# Patient Record
Sex: Female | Born: 1955
Health system: Southern US, Community
[De-identification: ages and names within clinical notes are randomized; demographics above are authoritative.]

## PROBLEM LIST (undated history)

## (undated) DIAGNOSIS — K219 Gastro-esophageal reflux disease without esophagitis: Secondary | ICD-10-CM

## (undated) DIAGNOSIS — G43909 Migraine, unspecified, not intractable, without status migrainosus: Secondary | ICD-10-CM

## (undated) DIAGNOSIS — E785 Hyperlipidemia, unspecified: Secondary | ICD-10-CM

## (undated) DIAGNOSIS — M1990 Unspecified osteoarthritis, unspecified site: Secondary | ICD-10-CM

## (undated) DIAGNOSIS — C801 Malignant (primary) neoplasm, unspecified: Secondary | ICD-10-CM

## (undated) DIAGNOSIS — I10 Essential (primary) hypertension: Secondary | ICD-10-CM

## (undated) DIAGNOSIS — Z9889 Other specified postprocedural states: Secondary | ICD-10-CM

## (undated) DIAGNOSIS — Z86010 Personal history of colonic polyps: Secondary | ICD-10-CM

## (undated) DIAGNOSIS — T7840XA Allergy, unspecified, initial encounter: Secondary | ICD-10-CM

## (undated) DIAGNOSIS — F419 Anxiety disorder, unspecified: Secondary | ICD-10-CM

## (undated) DIAGNOSIS — Z860101 Personal history of adenomatous and serrated colon polyps: Secondary | ICD-10-CM

## (undated) HISTORY — DX: Gastro-esophageal reflux disease without esophagitis: K21.9

## (undated) HISTORY — PX: CHOLECYSTECTOMY: SHX55

## (undated) HISTORY — PX: COLONOSCOPY: SHX174

## (undated) HISTORY — DX: Personal history of colonic polyps: Z86.010

## (undated) HISTORY — DX: Personal history of adenomatous and serrated colon polyps: Z86.0101

## (undated) HISTORY — DX: Malignant (primary) neoplasm, unspecified: C80.1

## (undated) HISTORY — PX: BUNIONECTOMY: SHX129

## (undated) HISTORY — DX: Unspecified osteoarthritis, unspecified site: M19.90

## (undated) HISTORY — PX: ROTATOR CUFF REPAIR: SHX139

## (undated) HISTORY — DX: Anxiety disorder, unspecified: F41.9

## (undated) HISTORY — PX: DILATION AND CURETTAGE OF UTERUS: SHX78

## (undated) HISTORY — PX: KNEE ARTHROSCOPY WITH LATERAL RELEASE: SHX5649

## (undated) HISTORY — PX: POLYPECTOMY: SHX149

## (undated) HISTORY — DX: Allergy, unspecified, initial encounter: T78.40XA

## (undated) HISTORY — PX: TONSILLECTOMY: SUR1361

## (undated) HISTORY — PX: JOINT REPLACEMENT: SHX530

## (undated) HISTORY — DX: Migraine, unspecified, not intractable, without status migrainosus: G43.909

## (undated) HISTORY — DX: Hyperlipidemia, unspecified: E78.5

## (undated) HISTORY — DX: Essential (primary) hypertension: I10

---

## 2017-02-18 ENCOUNTER — Other Ambulatory Visit: Payer: Self-pay | Admitting: Family Medicine

## 2017-02-18 ENCOUNTER — Ambulatory Visit
Admission: RE | Admit: 2017-02-18 | Discharge: 2017-02-18 | Disposition: A | Discharge status: Home / Self Care | Source: Ambulatory Visit | Attending: Family Medicine | Admitting: Family Medicine

## 2017-02-18 DIAGNOSIS — M1990 Unspecified osteoarthritis, unspecified site: Secondary | ICD-10-CM

## 2017-12-15 ENCOUNTER — Other Ambulatory Visit: Payer: Self-pay | Admitting: Family Medicine

## 2017-12-15 DIAGNOSIS — Z1231 Encounter for screening mammogram for malignant neoplasm of breast: Secondary | ICD-10-CM

## 2018-04-05 ENCOUNTER — Encounter: Payer: Self-pay | Admitting: Radiology

## 2018-04-05 ENCOUNTER — Ambulatory Visit
Admission: RE | Admit: 2018-04-05 | Discharge: 2018-04-05 | Disposition: A | Discharge status: Home / Self Care | Source: Ambulatory Visit | Attending: Family Medicine | Admitting: Family Medicine

## 2018-04-05 DIAGNOSIS — Z1231 Encounter for screening mammogram for malignant neoplasm of breast: Secondary | ICD-10-CM

## 2018-04-16 ENCOUNTER — Other Ambulatory Visit: Payer: Self-pay | Admitting: Family Medicine

## 2018-04-16 DIAGNOSIS — R928 Other abnormal and inconclusive findings on diagnostic imaging of breast: Secondary | ICD-10-CM

## 2018-04-19 ENCOUNTER — Ambulatory Visit
Admission: RE | Admit: 2018-04-19 | Discharge: 2018-04-19 | Disposition: A | Discharge status: Home / Self Care | Source: Ambulatory Visit | Attending: Family Medicine | Admitting: Family Medicine

## 2018-04-19 DIAGNOSIS — R928 Other abnormal and inconclusive findings on diagnostic imaging of breast: Secondary | ICD-10-CM

## 2018-10-21 ENCOUNTER — Other Ambulatory Visit: Payer: Self-pay | Admitting: Family Medicine

## 2018-10-21 ENCOUNTER — Ambulatory Visit
Admission: RE | Admit: 2018-10-21 | Discharge: 2018-10-21 | Disposition: A | Discharge status: Home / Self Care | Source: Ambulatory Visit | Attending: Family Medicine | Admitting: Family Medicine

## 2018-10-21 ENCOUNTER — Other Ambulatory Visit: Payer: Self-pay

## 2018-10-21 DIAGNOSIS — M25561 Pain in right knee: Secondary | ICD-10-CM

## 2018-10-26 ENCOUNTER — Telehealth: Payer: Self-pay | Admitting: Gastroenterology

## 2018-10-26 NOTE — Telephone Encounter (Signed)
Recv'd medical records from [facility] forwarded 3 pages to [place] (Dr. Armbruster)8/11/20fbg

## 2018-11-01 ENCOUNTER — Telehealth: Payer: Self-pay | Admitting: Gastroenterology

## 2018-11-01 NOTE — Telephone Encounter (Signed)
DOD 10/22/18 Dr. Tarri Glenn,  There is a referral for pt to have a colonoscopy.  Previous colonoscopy report from 2017 will be sent to you for review.

## 2018-11-03 ENCOUNTER — Encounter: Payer: Self-pay | Admitting: Gastroenterology

## 2018-11-25 ENCOUNTER — Ambulatory Visit (AMBULATORY_SURGERY_CENTER): Payer: Self-pay | Admitting: *Deleted

## 2018-11-25 ENCOUNTER — Other Ambulatory Visit: Payer: Self-pay

## 2018-11-25 VITALS — Temp 97.1°F | Ht 66.0 in | Wt 150.0 lb

## 2018-11-25 DIAGNOSIS — Z86010 Personal history of colonic polyps: Secondary | ICD-10-CM

## 2018-11-25 MED ORDER — SUPREP BOWEL PREP KIT 17.5-3.13-1.6 GM/177ML PO SOLN: 1.0000 | Freq: Once | ORAL | 0 refills | Status: AC

## 2018-11-25 NOTE — Progress Notes (Signed)
Slow to wake post op  No egg or soy allergy known to patient  No issues with past sedation with any surgeries  or procedures, no intubation problems  No diet pills per patient No home 02 use per patient  No blood thinners per patient  Pt states  issues with constipation - she will go several days with no BM's then she will have lots of BM's- not diarrhea - pt does not take meds to have results- when she increases water intake, she has good BM's  No A fib or A flutter  EMMI video sent to pt's e mail

## 2018-11-30 ENCOUNTER — Encounter: Payer: Self-pay | Admitting: Gastroenterology

## 2018-12-09 ENCOUNTER — Ambulatory Visit (AMBULATORY_SURGERY_CENTER): Payer: Medicare Other | Admitting: Gastroenterology

## 2018-12-09 ENCOUNTER — Other Ambulatory Visit: Payer: Self-pay

## 2018-12-09 ENCOUNTER — Encounter: Payer: Self-pay | Admitting: Gastroenterology

## 2018-12-09 VITALS — BP 109/68 | HR 72 | Temp 98.1°F | Resp 7 | Ht 66.0 in | Wt 150.0 lb

## 2018-12-09 DIAGNOSIS — D123 Benign neoplasm of transverse colon: Secondary | ICD-10-CM | POA: Diagnosis not present

## 2018-12-09 DIAGNOSIS — Z86010 Personal history of colonic polyps: Secondary | ICD-10-CM

## 2018-12-09 DIAGNOSIS — D120 Benign neoplasm of cecum: Secondary | ICD-10-CM

## 2018-12-09 MED ORDER — SODIUM CHLORIDE 0.9 % IV SOLN: 500.0000 mL | Freq: Once | INTRAVENOUS | Status: DC

## 2018-12-09 NOTE — Op Note (Addendum)
[place] Patient Name: Jerrilyn Schwenn Procedure Date: 12/09/2018 8:53 AM MRN: [id number] Endoscopist: Thornton Park MD, MD Age: 63 Referring MD:  Date of Birth: 07-20-1955 Gender: Female Account #: [id number] Procedure:                Colonoscopy Indications:              Surveillance: Personal history of adenomatous                            polyps on last colonoscopy 3 years ago                           Colonoscopy 08/01/15 in [place]: 5 polyps, sigmoid                            diverticulosis                           Adenomas and hyperplastic polyps on pathology                           Surveillance recommended in 3 years Medicines:                See the Anesthesia note for documentation of the                            administered medications Procedure:                Pre-Anesthesia Assessment:                           - Prior to the procedure, a History and Physical                            was performed, and patient medications and                            allergies were reviewed. The patient's tolerance of                            previous anesthesia was also reviewed. The risks                            and benefits of the procedure and the sedation                            options and risks were discussed with the patient.                            All questions were answered, and informed consent                            was obtained. Prior Anticoagulants: The patient has                            taken  no previous anticoagulant or antiplatelet                            agents. ASA Grade Assessment: II - A patient with                            mild systemic disease. After reviewing the risks                            and benefits, the patient was deemed in                            satisfactory condition to undergo the procedure.                           After obtaining informed consent, the colonoscope                            was  passed under direct vision. Throughout the                            procedure, the patient's blood pressure, pulse, and                            oxygen saturations were monitored continuously. The                            Colonoscope was introduced through the anus and                            advanced to the the terminal ileum, with                            identification of the appendiceal orifice and IC                            valve. A second forward view of the right colon was                            performed. The colonoscopy was performed without                            difficulty. The patient tolerated the procedure                            well. The quality of the bowel preparation was                            good. The terminal ileum, ileocecal valve,                            appendiceal orifice, and rectum were photographed. Scope In: 8:56:22 AM Scope Out: 9:16:31 AM Scope Withdrawal Time: 0 hours 16 minutes 56 seconds  Total Procedure Duration: 0 hours 20 minutes 9 seconds  Findings:                 The perianal and digital rectal examinations were                            normal.                           A few small-mouthed diverticula were found in the                            sigmoid colon.                           A 2 mm polyp was found in the splenic flexure. The                            polyp was sessile. The polyp was removed with a                            cold snare. Resection and retrieval were complete.                            Estimated blood loss was minimal.                           A 3 mm polyp was found in the hepatic flexure. The                            polyp was sessile. The polyp was removed with a                            cold snare. Resection and retrieval were complete.                            Estimated blood loss was minimal.                           Three sessile polyps were found in the cecum. The                             polyps were 1 to 3 mm in size. These polyps were                            removed with a cold snare. Resection and retrieval                            were complete. Estimated blood loss was minimal.                           The exam was otherwise without abnormality on  direct and retroflexion views. Complications:            No immediate complications. Estimated blood loss:                            Minimal. Estimated Blood Loss:     Estimated blood loss was minimal. Impression:               - Diverticulosis in the sigmoid colon.                           - One 2 mm polyp at the splenic flexure, removed                            with a cold snare. Resected and retrieved.                           - One 3 mm polyp at the hepatic flexure, removed                            with a cold snare. Resected and retrieved.                           - Three 1 to 3 mm polyps in the cecum, removed with                            a cold snare. Resected and retrieved.                           - The examination was otherwise normal on direct                            and retroflexion views. Recommendation:           - Patient has a contact number available for                            emergencies. The signs and symptoms of potential                            delayed complications were discussed with the                            patient. Return to normal activities tomorrow.                            Written discharge instructions were provided to the                            patient.                           - Resume previous diet today. High fiber diet  recommended.                           - Continue present medications.                           - Await pathology results.                           - Repeat colonoscopy in 3 years for surveillance if                            at least 3 polyps are  adenomas. Thornton Park MD, MD 12/09/2018 9:21:49 AM This report has been signed electronically.

## 2018-12-09 NOTE — Progress Notes (Signed)
To PACU, VSS. Report to [email or web address] 

## 2018-12-09 NOTE — Patient Instructions (Signed)
YOU HAD AN ENDOSCOPIC PROCEDURE TODAY AT [facility]:   Refer to the procedure report that was given to you for any specific questions about what was found during the examination.  If the procedure report does not answer your questions, please call your gastroenterologist to clarify.  If you requested that your care partner not be given the details of your procedure findings, then the procedure report has been included in a sealed envelope for you to review at your convenience later.  YOU SHOULD EXPECT: Some feelings of bloating in the abdomen. Passage of more gas than usual.  Walking can help get rid of the air that was put into your GI tract during the procedure and reduce the bloating. If you had a lower endoscopy (such as a colonoscopy or flexible sigmoidoscopy) you may notice spotting of blood in your stool or on the toilet paper. If you underwent a bowel prep for your procedure, you may not have a normal bowel movement for a few days.  Please Note:  You might notice some irritation and congestion in your nose or some drainage.  This is from the oxygen used during your procedure.  There is no need for concern and it should clear up in a day or so.  SYMPTOMS TO REPORT IMMEDIATELY:   Following lower endoscopy (colonoscopy or flexible sigmoidoscopy):  Excessive amounts of blood in the stool  Significant tenderness or worsening of abdominal pains  Swelling of the abdomen that is new, acute  Fever of 100F or higher  For urgent or emergent issues, a gastroenterologist can be reached at any hour by calling [phone].   DIET:  We do recommend a small meal at first, but then you may proceed to your regular diet.  Drink plenty of fluids but you should avoid alcoholic beverages for 24 hours.  ACTIVITY:  You should plan to take it easy for the rest of today and you should NOT DRIVE or use heavy machinery until tomorrow (because of the sedation medicines used during the test).     FOLLOW UP: Our staff will call the number listed on your records 48-72 hours following your procedure to check on you and address any questions or concerns that you may have regarding the information given to you following your procedure. If we do not reach you, we will leave a message.  We will attempt to reach you two times.  During this call, we will ask if you have developed any symptoms of COVID 19. If you develop any symptoms (ie: fever, flu-like symptoms, shortness of breath, cough etc.) before then, please call [phone].  If you test positive for Covid 19 in the 2 weeks post procedure, please call and report this information to [place].    If any biopsies were taken you will be contacted by phone or by letter within the next 1-3 weeks.  Please call us at [phone] if you have not heard about the biopsies in 3 weeks.    SIGNATURES/CONFIDENTIALITY: You and/or your care partner have signed paperwork which will be entered into your electronic medical record.  These signatures attest to the fact that that the information above on your After Visit Summary has been reviewed and is understood.  Full responsibility of the confidentiality of this discharge information lies with you and/or your care-partner.  Await pathology- next colonoscopy- 10 years  Continue your normal medications  Please read over handouts about polyps, diverticulosis and high fiber diets

## 2018-12-09 NOTE — Progress Notes (Signed)
Called to room to assist during endoscopic procedure.  Patient ID and intended procedure confirmed with present staff. Received instructions for my participation in the procedure from the performing physician.  

## 2018-12-13 ENCOUNTER — Telehealth: Payer: Self-pay | Admitting: *Deleted

## 2018-12-13 ENCOUNTER — Encounter: Payer: Self-pay | Admitting: Gastroenterology

## 2018-12-13 NOTE — Telephone Encounter (Signed)
  Follow up Call-  Call back number 12/09/2018  Post procedure Call Back phone  # [phone]  Permission to leave phone message Yes     Patient questions:  Message left to call us if necessary.

## 2018-12-13 NOTE — Telephone Encounter (Signed)
  Follow up Call-  Call back number 12/09/2018  Post procedure Call Back phone  # [phone]  Permission to leave phone message Yes     Patient questions:  Do you have a fever, pain , or abdominal swelling? No. Pain Score  0 *  Have you tolerated food without any problems? Yes.    Have you been able to return to your normal activities? Yes.    Do you have any questions about your discharge instructions: Diet   No. Medications  No. Follow up visit  No.  Do you have questions or concerns about your Care? No.  Actions: * If pain score is 4 or above: No action needed, pain <4.  1. Have you developed a fever since your procedure? no  2.   Have you had an respiratory symptoms (SOB or cough) since your procedure? no  3.   Have you tested positive for COVID 19 since your procedure no  4.   Have you had any family members/close contacts diagnosed with the COVID 19 since your procedure?  no   If yes to any of these questions please route to Joylene John, RN and Alphonsa Gin, Therapist, sports.

## 2019-04-08 ENCOUNTER — Other Ambulatory Visit: Payer: Self-pay | Admitting: Family Medicine

## 2019-04-08 DIAGNOSIS — Z1231 Encounter for screening mammogram for malignant neoplasm of breast: Secondary | ICD-10-CM

## 2019-05-16 ENCOUNTER — Ambulatory Visit
Admission: RE | Admit: 2019-05-16 | Discharge: 2019-05-16 | Disposition: A | Discharge status: Home / Self Care | Payer: Medicare Other | Source: Ambulatory Visit | Attending: Family Medicine | Admitting: Family Medicine

## 2019-05-16 ENCOUNTER — Other Ambulatory Visit: Payer: Self-pay

## 2019-05-16 DIAGNOSIS — Z1231 Encounter for screening mammogram for malignant neoplasm of breast: Secondary | ICD-10-CM

## 2019-05-18 ENCOUNTER — Other Ambulatory Visit: Payer: Self-pay | Admitting: Family Medicine

## 2019-05-18 DIAGNOSIS — R928 Other abnormal and inconclusive findings on diagnostic imaging of breast: Secondary | ICD-10-CM

## 2019-05-19 ENCOUNTER — Other Ambulatory Visit: Payer: Self-pay

## 2019-05-19 ENCOUNTER — Ambulatory Visit
Admission: RE | Admit: 2019-05-19 | Discharge: 2019-05-19 | Disposition: A | Discharge status: Home / Self Care | Payer: Medicare Other | Source: Ambulatory Visit | Attending: Family Medicine | Admitting: Family Medicine

## 2019-05-19 DIAGNOSIS — R928 Other abnormal and inconclusive findings on diagnostic imaging of breast: Secondary | ICD-10-CM

## 2019-07-25 ENCOUNTER — Encounter: Payer: Self-pay | Admitting: Dermatology

## 2019-07-25 ENCOUNTER — Ambulatory Visit (INDEPENDENT_AMBULATORY_CARE_PROVIDER_SITE_OTHER): Payer: Medicare Other | Admitting: Dermatology

## 2019-07-25 ENCOUNTER — Other Ambulatory Visit: Payer: Self-pay

## 2019-07-25 DIAGNOSIS — D2272 Melanocytic nevi of left lower limb, including hip: Secondary | ICD-10-CM | POA: Diagnosis not present

## 2019-07-25 DIAGNOSIS — D0461 Carcinoma in situ of skin of right upper limb, including shoulder: Secondary | ICD-10-CM

## 2019-07-25 DIAGNOSIS — D229 Melanocytic nevi, unspecified: Secondary | ICD-10-CM

## 2019-07-25 DIAGNOSIS — D049 Carcinoma in situ of skin, unspecified: Secondary | ICD-10-CM

## 2019-07-25 NOTE — Progress Notes (Signed)
Patient will get copy of vaginal biopsy and bring to office. Not present in the chart

## 2019-07-25 NOTE — Progress Notes (Signed)
    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  The lesion at the base of the right thumb could be an early carcinoma in situ Taylor Boyer opinion) okay with her trying the same imiquimod therapy.  Should this fail she will return for possible culture or biopsy.  No other suspicious lesions on face, neck, arms.  The mole on her right calf was rechecked and showed normal dermoscopy.

## 2019-07-29 ENCOUNTER — Encounter: Payer: Self-pay | Admitting: Dermatology

## 2019-11-28 ENCOUNTER — Ambulatory Visit: Payer: Medicare Other | Admitting: Dermatology

## 2020-02-20 ENCOUNTER — Other Ambulatory Visit: Payer: Self-pay | Admitting: Family Medicine

## 2020-02-20 DIAGNOSIS — E2839 Other primary ovarian failure: Secondary | ICD-10-CM

## 2020-02-20 DIAGNOSIS — Z1231 Encounter for screening mammogram for malignant neoplasm of breast: Secondary | ICD-10-CM

## 2020-05-31 ENCOUNTER — Ambulatory Visit
Admission: RE | Admit: 2020-05-31 | Discharge: 2020-05-31 | Disposition: A | Discharge status: Home / Self Care | Payer: Medicare Other | Source: Ambulatory Visit | Attending: Family Medicine | Admitting: Family Medicine

## 2020-05-31 ENCOUNTER — Other Ambulatory Visit: Payer: Self-pay

## 2020-05-31 DIAGNOSIS — E2839 Other primary ovarian failure: Secondary | ICD-10-CM

## 2020-05-31 DIAGNOSIS — Z1231 Encounter for screening mammogram for malignant neoplasm of breast: Secondary | ICD-10-CM

## 2020-07-26 ENCOUNTER — Other Ambulatory Visit: Payer: Self-pay | Admitting: Family Medicine

## 2020-07-26 DIAGNOSIS — M25561 Pain in right knee: Secondary | ICD-10-CM

## 2020-12-14 HISTORY — PX: OTHER SURGICAL HISTORY: SHX169

## 2020-12-19 ENCOUNTER — Ambulatory Visit: Payer: Medicare Other | Attending: Surgery | Admitting: Physical Therapy

## 2020-12-19 ENCOUNTER — Encounter: Payer: Self-pay | Admitting: Physical Therapy

## 2020-12-19 ENCOUNTER — Other Ambulatory Visit: Payer: Self-pay

## 2020-12-19 DIAGNOSIS — M6281 Muscle weakness (generalized): Secondary | ICD-10-CM | POA: Diagnosis present

## 2020-12-19 DIAGNOSIS — G8929 Other chronic pain: Secondary | ICD-10-CM | POA: Insufficient documentation

## 2020-12-19 DIAGNOSIS — R600 Localized edema: Secondary | ICD-10-CM | POA: Insufficient documentation

## 2020-12-19 DIAGNOSIS — R2689 Other abnormalities of gait and mobility: Secondary | ICD-10-CM | POA: Diagnosis present

## 2020-12-19 DIAGNOSIS — M25561 Pain in right knee: Secondary | ICD-10-CM | POA: Diagnosis not present

## 2020-12-19 NOTE — Therapy (Signed)
[place], [place], [address] Phone: [phone]   Fax:  [phone]  Physical Therapy Evaluation  Patient Details  Name: Taylor Boyer MRN: [id number] Date of Birth: [birth year] Referring Provider (PT): Lesle Reek DO   Encounter Date: 12/19/2020   PT End of Session - 12/19/20 1420     Visit Number 1    Number of Visits 15    Date for PT Re-Evaluation 02/13/21    Authorization Type [facility] - FOTO 6th and 10 th vsit    Authorization - Visit Number 1    Authorization - Number of Visits 15    Progress Note Due on Visit 15    PT Start Time 1418    PT Stop Time 1500    PT Time Calculation (min) 42 min    Activity Tolerance Patient tolerated treatment well    Behavior During Therapy WFL for tasks assessed/performed             Past Medical History:  Diagnosis Date   Allergy    Arthritis    knees   Cancer ([place])    basal cell skin cancer   History of adenomatous polyp of colon    Hyperlipidemia    Hypertension    Migraines     Past Surgical History:  Procedure Laterality Date   BUNIONECTOMY Right    CHOLECYSTECTOMY     COLONOSCOPY     KNEE ARTHROSCOPY WITH LATERAL RELEASE Right    POLYPECTOMY     R tka Right 12/14/2020   ROTATOR CUFF REPAIR Right    TONSILLECTOMY      There were no vitals filed for this visit.    Subjective Assessment - 12/19/20 1424     Subjective pt had RTKA on 11/27/2020. She reports she sid doing okay and 3 HHPT visits. Pain is improving, but she reprots she does limit her activity due to pain. Pain stays mostly in the knee and she reports continued intermittent swelling.  She currently ambulates with a SPC.    Limitations Standing;Walking    Currently in Pain? Yes    Pain Score 7    ibprofen before her appointment.   Pain Location Knee    Pain Orientation Right    Pain Descriptors / Indicators Aching    Pain Type Chronic pain    Pain Onset More than a month  ago    Pain Frequency Intermittent    Aggravating Factors  prlonged standing/ walking, bending the knee    Pain Relieving Factors medication    Effect of Pain on Daily Activities limited standing/ walking                [facility] PT Assessment - 12/19/20 0001       Assessment   Medical Diagnosis S/P R TKA    Referring Provider (PT) Lesle Reek DO    Onset Date/Surgical Date --   11/27/2020   Hand Dominance Right      Precautions   Precautions None      Restrictions   Weight Bearing Restrictions No      Balance Screen   Has the patient fallen in the past 6 months No    Has the patient had a decrease in activity level because of a fear of falling?  No    Is the patient reluctant to leave their home because of a fear of falling?  No      Home Environment   Living  Environment Private residence    Living Arrangements Spouse/significant other    Available Help at Discharge Family    Type of [place] to enter    Entrance Stairs-Number of Steps 2    Entrance Stairs-Rails None    Home Layout Two level    Alternate Level Stairs-Number of Steps 20    Alternate Level Stairs-Rails Can reach [employer] - single point;Walker - 2 wheels      Prior Function   Level of Independence Independent    Vocation Retired      Charity fundraiser Status Within Functional Limits for tasks assessed    Memory Appears intact      Observation/Other Assessments   Skin [employer] incison appears clean, dry    Focus on Therapeutic Outcomes (FOTO)  35%   predicted 56% limited     ROM / Strength   AROM / PROM / Strength AROM;Strength;PROM      AROM   AROM Assessment Site Knee    Right/Left Knee Right;Left    Right Knee Extension 25    Right Knee Flexion 95    Left Knee Extension 0    Left Knee Flexion 140      PROM   PROM Assessment Site Knee    Right/Left Knee Right    Right Knee Extension 20    Right Knee Flexion 98       Strength   Overall Strength Unable to assess;Due to pain    Strength Assessment Site Hip;Knee    Right/Left Knee Right      Palpation   Palpation comment TTP along the inscision site.( worse with bending), and along the distal hamstring with extending      Ambulation/Gait   Ambulation/Gait Yes    Assistive device Straight cane    Gait Pattern Decreased stride length;Decreased stance time - right;Decreased step length - left;Trendelenburg;Antalgic                        Objective measurements completed on examination: See above findings.                PT Education - 12/19/20 1433     Education Details evaluation findings, POC, goals, HEP with proper form/ rationale, reviewed FOTO assessment    Person(s) Educated Patient    Methods Explanation;Verbal cues;Handout    Comprehension Verbalized understanding;Verbal cues required              PT Short Term Goals - 12/19/20 1556       PT SHORT TERM GOAL #1   Title pt to be IND with initial HEP    Time 4    Period Weeks    Status New    Target Date 01/16/21      PT SHORT TERM GOAL #2   Title increaes knee flexion to >/= 110 degrees, and extension to </= 10 degress for therapuetic progression    Time 4    Period Weeks    Status New    Target Date 01/16/21      PT SHORT TERM GOAL #3   Title pt to verbalize/ demo efficient gait with heel strike/ toe off  with LRAD for functional mobility    Time 4    Period Weeks    Status New    Target Date 01/16/21  PT Long Term Goals - 12/19/20 1636       PT LONG TERM GOAL #1   Title increase R knee active total arc ROM to >/= 5 to 120 with </= 2/10 max pain for funtional ROM required for ADLs    Time 8    Period Weeks    Status New    Target Date 02/13/21      PT LONG TERM GOAL #2   Title increase RLE gross strength to >/= 4+/5 to promote knee stability with walking/ standing    Time 8    Period Weeks    Status New    Target  Date 02/13/21      PT LONG TERM GOAL #3   Title pt to be able to walk/ stand for >/= 60 min and navigate up/ down >/= 12 steps reciprocally with LRAD for functional mobility/ endurance required for in home and community amb    Time 8    Period Weeks    Status New    Target Date 02/13/21      PT LONG TERM GOAL #4   Title increase FOTO score to >/= 56% to demo improvement in function    Time 8    Period Weeks    Status New    Target Date 02/13/21      PT LONG TERM GOAL #5   Title pt to be IND with all HEP to maintain and progress her current LFO IND    Time 8    Period Weeks    Status New    Target Date 02/13/21                    Plan - 12/19/20 1434     Clinical Impression Statement pt is a pleasnt 65 y.o F presenting to [place] s/p R TKA on 11/27/2020 and had 3 HHPT visits prior to todays visit. She demonstrates limited AROM/ PROM Knee ROM with total active arc 25 - 95 degrees today, held off on MMT due to irritability of pain. The incision site appears to be clean/ dry and healing well, and TTP along the incisoin/ patellar tendon and along the distal hamstring.she exhibits and antalgic step through pattern with limited stance on the RLE/ stride with LLE with SPC. She would benefit from physical therapy to decrease  Rknee pain, increaes ROM/ strength, improve gait biomechanics, and return to PLOF by addressing the deficits listed.    Personal Factors and Comorbidities Comorbidity 1    Comorbidities hx of CX    Examination-Activity Limitations Stand;Locomotion Level    Stability/Clinical Decision Making Stable/Uncomplicated    Clinical Decision Making Low    Rehab Potential Good    PT Frequency 2x / week    PT Duration 8 weeks    PT Treatment/Interventions ADLs/Self Care Home Management;Cryotherapy;Electrical Stimulation;Iontophoresis 4mg /ml Dexamethasone;Moist Heat;Ultrasound;Gait training;Stair training;Functional mobility training;Therapeutic activities;Therapeutic  exercise;Balance training;Neuromuscular re-education;Patient/family education;Manual techniques;Taping;Dry needling;Passive range of motion;Vasopneumatic Device    PT Next Visit Plan review/ update HEP PRN, knee ROM/ mobs, gross hip /knee strengthening ,gait training, vaso for swelling PRN    PT Home Exercise Plan [id number] - quad set, heel slides with strap, SLR, sidelying hip abduction,    Consulted and Agree with Plan of Care Patient             Patient will benefit from skilled therapeutic intervention in order to improve the following deficits and impairments:  Pain, Improper body mechanics, Abnormal gait, Increased muscle spasms, Postural dysfunction,  Decreased activity tolerance, Decreased endurance, Decreased balance, Decreased range of motion, Increased edema  Visit Diagnosis: Chronic pain of right knee  Other abnormalities of gait and mobility  Localized edema  Muscle weakness (generalized)     Problem List There are no problems to display for this patient.  [name] PT, DPT, LAT, ATC  12/19/20  4:40 PM     [facility] [address] [place], [place], [address] Phone: [phone]   Fax:  [phone]  Name: Taylor Boyer MRN: [id number] Date of Birth: 1956/02/11

## 2020-12-29 ENCOUNTER — Other Ambulatory Visit: Payer: Self-pay

## 2020-12-29 ENCOUNTER — Encounter: Payer: Self-pay | Admitting: Physical Therapy

## 2020-12-29 ENCOUNTER — Ambulatory Visit: Payer: Medicare Other | Admitting: Physical Therapy

## 2020-12-29 DIAGNOSIS — M25561 Pain in right knee: Secondary | ICD-10-CM | POA: Diagnosis not present

## 2020-12-29 DIAGNOSIS — R2689 Other abnormalities of gait and mobility: Secondary | ICD-10-CM

## 2020-12-29 DIAGNOSIS — R600 Localized edema: Secondary | ICD-10-CM

## 2020-12-29 DIAGNOSIS — M6281 Muscle weakness (generalized): Secondary | ICD-10-CM

## 2020-12-29 DIAGNOSIS — G8929 Other chronic pain: Secondary | ICD-10-CM

## 2020-12-29 NOTE — Patient Instructions (Signed)
Access Code: [email or web address] URL: [email or web address] Date: 12/29/2020 Prepared by: Shearon Balo  Exercises Supine Quad Set - 1 x daily - 7 x weekly - 2 sets - 10 reps - 5 seconds hold SLR - 1 x daily - 7 x weekly - 2 sets - 10 reps - 1 hold Supine Heel Slide with Strap - 1 x daily - 7 x weekly - 2 sets - 10 reps - 5 seconds hold Sidelying Hip Abduction - 1 x daily - 7 x weekly - 2 sets - 10 reps Seated Hamstring Stretch - 1 x daily - 7 x weekly - 2 sets - 2 reps - 30 hold Standing Terminal Knee Extension with Resistance - 1 x daily - 7 x weekly - 3 sets - 10 reps Seated Knee Extension Stretch with Chair - 1 x daily - 7 x weekly - 1 sets - 1 reps - 60 min total/day hold

## 2020-12-29 NOTE — Therapy (Signed)
[place], [place], [address] Phone: [phone]   Fax:  [phone]  Physical Therapy Treatment  Patient Details  Name: Taylor Boyer MRN: [id number] Date of Birth: [birth year] Referring Provider (PT): Lesle Reek DO   Encounter Date: 12/29/2020   PT End of Session - 12/29/20 0946     Visit Number 2    Number of Visits 15    Date for PT Re-Evaluation 02/13/21    Authorization Type [facility] - FOTO 6th and 10 th vsit    Authorization - Visit Number 2    Authorization - Number of Visits 15    Progress Note Due on Visit 15    PT Start Time 0945    PT Stop Time 1030    PT Time Calculation (min) 45 min    Activity Tolerance Patient tolerated treatment well    Behavior During Therapy [facility] for tasks assessed/performed             Past Medical History:  Diagnosis Date   Allergy    Arthritis    knees   Cancer ([place])    basal cell skin cancer   History of adenomatous polyp of colon    Hyperlipidemia    Hypertension    Migraines     Past Surgical History:  Procedure Laterality Date   BUNIONECTOMY Right    CHOLECYSTECTOMY     COLONOSCOPY     KNEE ARTHROSCOPY WITH LATERAL RELEASE Right    POLYPECTOMY     R tka Right 12/14/2020   ROTATOR CUFF REPAIR Right    TONSILLECTOMY      There were no vitals filed for this visit.   Subjective Assessment - 12/29/20 0951     Subjective Pt reports that her knee is doing well overall.  She reports compliance with HEP.  She has been having more stiffness since the weather has gotten cold.  6/10 R knee pain "ache"    Limitations Standing;Walking    Pain Onset More than a month ago                [facility] PT Assessment - 12/29/20 0001       AROM   Right Knee Extension 16             [facility] Adult PT Treatment/Exercise:  Therapeutic Exercise: - nu-step L5 35m while taking subjective and planning session with patient - quad set with manual OP -  quad set with 5# weight over distal femur - 2x10 - TKE with blue band - 2x20 - Step up forward and lateral 2x10 (emphasis on ext)  Manual Therapy: - IASTM with percussion device - distal HS and proximal gastroc - lateral      PT Short Term Goals - 12/19/20 1556       PT SHORT TERM GOAL #1   Title pt to be IND with initial HEP    Time 4    Period Weeks    Status New    Target Date 01/16/21      PT SHORT TERM GOAL #2   Title increaes knee flexion to >/= 110 degrees, and extension to </= 10 degress for therapuetic progression    Time 4    Period Weeks    Status New    Target Date 01/16/21      PT SHORT TERM GOAL #3   Title pt to verbalize/ demo efficient gait with heel strike/ toe off  with LRAD for  functional mobility    Time 4    Period Weeks    Status New    Target Date 01/16/21               PT Long Term Goals - 12/19/20 1636       PT LONG TERM GOAL #1   Title increase R knee active total arc ROM to >/= 5 to 120 with </= 2/10 max pain for funtional ROM required for ADLs    Time 8    Period Weeks    Status New    Target Date 02/13/21      PT LONG TERM GOAL #2   Title increase RLE gross strength to >/= 4+/5 to promote knee stability with walking/ standing    Time 8    Period Weeks    Status New    Target Date 02/13/21      PT LONG TERM GOAL #3   Title pt to be able to walk/ stand for >/= 60 min and navigate up/ down >/= 12 steps reciprocally with LRAD for functional mobility/ endurance required for in home and community amb    Time 8    Period Weeks    Status New    Target Date 02/13/21      PT LONG TERM GOAL #4   Title increase FOTO score to >/= 56% to demo improvement in function    Time 8    Period Weeks    Status New    Target Date 02/13/21      PT LONG TERM GOAL #5   Title pt to be IND with all HEP to maintain and progress her current LFO IND    Time 8    Period Weeks    Status New    Target Date 02/13/21                    Plan - 12/29/20 1029     Clinical Impression Statement Pt reports no increase in baseline pain following therapy  HEP was updated and reissued to patient; pt educated on HEP, was provided handout, and verbally confirmed understanding of exercises.    Overall, Taylor Boyer is progressing well with therapy.  Today we concentrated on lower extremity strengthening, quad strengthening, and knee range of motion.  Pt shows improvement in knee ext.  I emphasized the importance of regaining knee ext.  Added in LLLD stretching for knee ext.  Pt will continue to benefit from skilled physical therapy to address remaining deficits and achieve listed goals.  Continue per POC.    Personal Factors and Comorbidities Comorbidity 1    Comorbidities hx of CX    Examination-Activity Limitations Stand;Locomotion Level    Stability/Clinical Decision Making Stable/Uncomplicated    Rehab Potential Good    PT Frequency 2x / week    PT Duration 8 weeks    PT Treatment/Interventions ADLs/Self Care Home Management;Cryotherapy;Electrical Stimulation;Iontophoresis 4mg /ml Dexamethasone;Moist Heat;Ultrasound;Gait training;Stair training;Functional mobility training;Therapeutic activities;Therapeutic exercise;Balance training;Neuromuscular re-education;Patient/family education;Manual techniques;Taping;Dry needling;Passive range of motion;Vasopneumatic Device    PT Next Visit Plan review/ update HEP PRN, knee ROM/ mobs, gross hip /knee strengthening ,gait training, vaso for swelling PRN    PT Home Exercise Plan [id number] - quad set, heel slides with strap, SLR, sidelying hip abduction,    Consulted and Agree with Plan of Care Patient             Patient will benefit from skilled therapeutic intervention in order to improve  the following deficits and impairments:  Pain, Improper body mechanics, Abnormal gait, Increased muscle spasms, Postural dysfunction, Decreased activity tolerance, Decreased endurance, Decreased  balance, Decreased range of motion, Increased edema  Visit Diagnosis: Chronic pain of right knee  Other abnormalities of gait and mobility  Localized edema  Muscle weakness (generalized)     Problem List There are no problems to display for this patient.   Mathis Dad, PT 12/29/2020, 10:30 AM  [facility] [address] [place], [place], [address] Phone: [phone]   Fax:  [phone]  Name: Taylor Boyer MRN: [id number] Date of Birth: [birth year]

## 2021-01-01 ENCOUNTER — Other Ambulatory Visit: Payer: Self-pay

## 2021-01-01 ENCOUNTER — Encounter: Payer: Self-pay | Admitting: Physical Therapy

## 2021-01-01 ENCOUNTER — Ambulatory Visit: Payer: Medicare Other | Admitting: Physical Therapy

## 2021-01-01 DIAGNOSIS — R600 Localized edema: Secondary | ICD-10-CM

## 2021-01-01 DIAGNOSIS — R2689 Other abnormalities of gait and mobility: Secondary | ICD-10-CM

## 2021-01-01 DIAGNOSIS — M25561 Pain in right knee: Secondary | ICD-10-CM | POA: Diagnosis not present

## 2021-01-01 DIAGNOSIS — M6281 Muscle weakness (generalized): Secondary | ICD-10-CM

## 2021-01-01 DIAGNOSIS — G8929 Other chronic pain: Secondary | ICD-10-CM

## 2021-01-01 NOTE — Patient Instructions (Signed)
Access Code: [email or web address] URL: [email or web address] Date: 01/01/2021 Prepared by: Shearon Balo  Exercises Supine Quad Set - 1 x daily - 7 x weekly - 2 sets - 10 reps - 5 seconds hold SLR - 1 x daily - 7 x weekly - 2 sets - 10 reps - 1 hold Supine Heel Slide with Strap - 1 x daily - 7 x weekly - 2 sets - 10 reps - 5 seconds hold Sidelying Hip Abduction - 1 x daily - 7 x weekly - 2 sets - 10 reps Seated Hamstring Stretch - 1 x daily - 7 x weekly - 2 sets - 2 reps - 30 hold Standing Terminal Knee Extension with Resistance - 1 x daily - 7 x weekly - 3 sets - 10 reps Seated Knee Extension Stretch with Chair - 1 x daily - 7 x weekly - 1 sets - 1 reps - 60 min total/day hold Gastroc Stretch on Wall - 1 x daily - 7 x weekly - 3 sets - 10 reps

## 2021-01-01 NOTE — Therapy (Addendum)
[place], [place], [address] Phone: [phone]   Fax:  [phone]  Physical Therapy Treatment  Patient Details  Name: Taylor Boyer MRN: [id number] Date of Birth: [birth year] Referring Provider (PT): Lesle Reek DO   Encounter Date: 01/01/2021   PT End of Session - 01/01/21 1217     Visit Number 3    Number of Visits 15    Date for PT Re-Evaluation 02/13/21    Authorization Type VA - FOTO 6th and 10 th vsit    Authorization - Visit Number 2    Authorization - Number of Visits 15    Progress Note Due on Visit 15    Activity Tolerance Patient tolerated treatment well    Behavior During Therapy [facility] for tasks assessed/performed            Time in: 12:15 p Time out: 1:00 p Total time: 45 min Past Medical History:  Diagnosis Date   Allergy    Arthritis    knees   Cancer ([place])    basal cell skin cancer   History of adenomatous polyp of colon    Hyperlipidemia    Hypertension    Migraines     Past Surgical History:  Procedure Laterality Date   BUNIONECTOMY Right    CHOLECYSTECTOMY     COLONOSCOPY     KNEE ARTHROSCOPY WITH LATERAL RELEASE Right    POLYPECTOMY     R tka Right 12/14/2020   ROTATOR CUFF REPAIR Right    TONSILLECTOMY      There were no vitals filed for this visit.   Subjective Assessment - 01/01/21 1221     Subjective Pt reports that she has been "working her knee" over the weekend.  She has been HEP compliant.  7/10 R knee pain "ache".    Limitations Standing;Walking    Pain Onset More than a month ago                [facility] PT Assessment - 01/01/21 0001       AROM   Right Knee Extension 11              PT Education - 01/01/21 1246     Education Details HEP            [facility] Adult PT Treatment/Exercise:   Therapeutic Exercise: - Bike while taking subjective and planning session with patient - quad set with manual OP - TKE with black  band - 3x15 - Step up forward and lateral 15x ea (emphasis on ext) - Slant board stretch - 3x45'' - R uni knee ext - 2x10 @5 # - Retro TM walking - 1' x2   Manual Therapy : - IASTM and STM - distal HS and proximal gastroc - lateral - Patellar mobs - 4 way     PT Short Term Goals - 12/19/20 1556       PT SHORT TERM GOAL #1   Title pt to be IND with initial HEP    Time 4    Period Weeks    Status New    Target Date 01/16/21      PT SHORT TERM GOAL #2   Title increaes knee flexion to >/= 110 degrees, and extension to </= 10 degress for therapuetic progression    Time 4    Period Weeks    Status New    Target Date 01/16/21      PT SHORT TERM GOAL #3  Title pt to verbalize/ demo efficient gait with heel strike/ toe off  with LRAD for functional mobility    Time 4    Period Weeks    Status New    Target Date 01/16/21               PT Long Term Goals - 12/19/20 1636       PT LONG TERM GOAL #1   Title increase R knee active total arc ROM to >/= 5 to 120 with </= 2/10 max pain for funtional ROM required for ADLs    Time 8    Period Weeks    Status New    Target Date 02/13/21      PT LONG TERM GOAL #2   Title increase RLE gross strength to >/= 4+/5 to promote knee stability with walking/ standing    Time 8    Period Weeks    Status New    Target Date 02/13/21      PT LONG TERM GOAL #3   Title pt to be able to walk/ stand for >/= 60 min and navigate up/ down >/= 12 steps reciprocally with LRAD for functional mobility/ endurance required for in home and community amb    Time 8    Period Weeks    Status New    Target Date 02/13/21      PT LONG TERM GOAL #4   Title increase FOTO score to >/= 56% to demo improvement in function    Time 8    Period Weeks    Status New    Target Date 02/13/21      PT LONG TERM GOAL #5   Title pt to be IND with all HEP to maintain and progress her current LFO IND    Time 8    Period Weeks    Status New    Target Date  02/13/21                   Plan - 01/01/21 1255     Clinical Impression Statement Pt reports no increase in baseline pain following therapy  HEP was updated and reissued to patient; pt educated on HEP, was provided handout, and verbally confirmed understanding of exercises.    Overall, Taylor Boyer is progressing well with therapy.  Today we concentrated on quad strengthening and knee range of motion.  Pt continues to show improvement in R knee ext.  R G/S complex is significantly tight, G/S stretch added to HEP.  Pt will continue to benefit from skilled physical therapy to address remaining deficits and achieve listed goals.  Continue per POC.    Personal Factors and Comorbidities Comorbidity 1    Comorbidities hx of CX    Examination-Activity Limitations Stand;Locomotion Level    Stability/Clinical Decision Making Stable/Uncomplicated    Rehab Potential Good    PT Frequency 2x / week    PT Duration 8 weeks    PT Treatment/Interventions ADLs/Self Care Home Management;Cryotherapy;Electrical Stimulation;Iontophoresis 4mg /ml Dexamethasone;Moist Heat;Ultrasound;Gait training;Stair training;Functional mobility training;Therapeutic activities;Therapeutic exercise;Balance training;Neuromuscular re-education;Patient/family education;Manual techniques;Taping;Dry needling;Passive range of motion;Vasopneumatic Device    PT Next Visit Plan review/ update HEP PRN, knee ROM/ mobs, gross hip /knee strengthening ,gait training, vaso for swelling PRN    PT Home Exercise Plan [id number] - quad set, heel slides with strap, SLR, sidelying hip abduction,    Consulted and Agree with Plan of Care Patient  Patient will benefit from skilled therapeutic intervention in order to improve the following deficits and impairments:  Pain, Improper body mechanics, Abnormal gait, Increased muscle spasms, Postural dysfunction, Decreased activity tolerance, Decreased endurance, Decreased balance,  Decreased range of motion, Increased edema  Visit Diagnosis: Chronic pain of right knee  Other abnormalities of gait and mobility  Localized edema  Muscle weakness (generalized)     Problem List There are no problems to display for this patient.   Mathis Dad, PT 01/01/2021, 12:56 PM  [facility] [address] [place], [place], [address] Phone: [phone]   Fax:  [phone]  Name: Taylor Boyer MRN: [id number] Date of Birth: [birth year]

## 2021-01-03 ENCOUNTER — Ambulatory Visit: Payer: Medicare Other | Admitting: Physical Therapy

## 2021-01-03 ENCOUNTER — Other Ambulatory Visit: Payer: Self-pay

## 2021-01-03 DIAGNOSIS — G8929 Other chronic pain: Secondary | ICD-10-CM

## 2021-01-03 DIAGNOSIS — M6281 Muscle weakness (generalized): Secondary | ICD-10-CM

## 2021-01-03 DIAGNOSIS — M25561 Pain in right knee: Secondary | ICD-10-CM | POA: Diagnosis not present

## 2021-01-03 DIAGNOSIS — R2689 Other abnormalities of gait and mobility: Secondary | ICD-10-CM

## 2021-01-03 DIAGNOSIS — R600 Localized edema: Secondary | ICD-10-CM

## 2021-01-03 NOTE — Therapy (Signed)
[place], [place], [address] Phone: [phone]   Fax:  [phone]  Physical Therapy Treatment  Patient Details  Name: Taylor Boyer MRN: [id number] Date of Birth: [birth year] Referring Provider (PT): Lesle Reek DO   Encounter Date: 01/03/2021   PT End of Session - 01/03/21 0959     Visit Number 4    Number of Visits 15    Date for PT Re-Evaluation 02/13/21    Authorization Type VA - FOTO 6th and 10 th vsit    Authorization - Visit Number 4    Authorization - Number of Visits 15    Progress Note Due on Visit 15    PT Start Time 1000    PT Stop Time 1045    PT Time Calculation (min) 45 min    Activity Tolerance Patient tolerated treatment well    Behavior During Therapy [facility] for tasks assessed/performed             Past Medical History:  Diagnosis Date   Allergy    Arthritis    knees   Cancer ([place])    basal cell skin cancer   History of adenomatous polyp of colon    Hyperlipidemia    Hypertension    Migraines     Past Surgical History:  Procedure Laterality Date   BUNIONECTOMY Right    CHOLECYSTECTOMY     COLONOSCOPY     KNEE ARTHROSCOPY WITH LATERAL RELEASE Right    POLYPECTOMY     R tka Right 12/14/2020   ROTATOR CUFF REPAIR Right    TONSILLECTOMY      There were no vitals filed for this visit.   Subjective Assessment - 01/03/21 1005     Subjective Pt reports that she has been stretching her HS and it is a bit sore (from stretching and the cold weather).  She has been HEP compliant.  7/10 R knee pain "ache".    Limitations Standing;Walking    Pain Onset More than a month ago                [facility] PT Assessment - 01/03/21 0001       AROM   Right Knee Extension 11              [facility] Adult PT Treatment/Exercise:   Therapeutic Exercise: - Bike while taking subjective and planning session with patient - quad set with manual OP - TKE with purple pull  up band - 3x15 - TKE with ball on wall - 3x15 - Step up forward and lateral 15x ea (emphasis on ext) - Slant board stretch - 3x45'' - R uni knee ext - 2x10 @5 # - Retro TM walking - 1' x2 (not today)   Manual Therapy : - IASTM and STM - distal HS and proximal gastroc - lateral (not today) - Patellar mobs - 4 way - 5 min of heat (posterior knee)       PT Short Term Goals - 12/19/20 1556       PT SHORT TERM GOAL #1   Title pt to be IND with initial HEP    Time 4    Period Weeks    Status New    Target Date 01/16/21      PT SHORT TERM GOAL #2   Title increaes knee flexion to >/= 110 degrees, and extension to </= 10 degress for therapuetic progression    Time 4    Period Weeks  Status New    Target Date 01/16/21      PT SHORT TERM GOAL #3   Title pt to verbalize/ demo efficient gait with heel strike/ toe off  with LRAD for functional mobility    Time 4    Period Weeks    Status New    Target Date 01/16/21               PT Long Term Goals - 12/19/20 1636       PT LONG TERM GOAL #1   Title increase R knee active total arc ROM to >/= 5 to 120 with </= 2/10 max pain for funtional ROM required for ADLs    Time 8    Period Weeks    Status New    Target Date 02/13/21      PT LONG TERM GOAL #2   Title increase RLE gross strength to >/= 4+/5 to promote knee stability with walking/ standing    Time 8    Period Weeks    Status New    Target Date 02/13/21      PT LONG TERM GOAL #3   Title pt to be able to walk/ stand for >/= 60 min and navigate up/ down >/= 12 steps reciprocally with LRAD for functional mobility/ endurance required for in home and community amb    Time 8    Period Weeks    Status New    Target Date 02/13/21      PT LONG TERM GOAL #4   Title increase FOTO score to >/= 56% to demo improvement in function    Time 8    Period Weeks    Status New    Target Date 02/13/21      PT LONG TERM GOAL #5   Title pt to be IND with all HEP to maintain  and progress her current LFO IND    Time 8    Period Weeks    Status New    Target Date 02/13/21                   Plan - 01/03/21 1046     Clinical Impression Statement Pt reports no increase in baseline pain following therapy  HEP was reviewed, but left unchanged    Overall, Lorell De Yolanda Manges is progressing well with therapy.  Today we concentrated on quad strengthening and knee range of motion.  From subjective report, pt was likely a bit aggressive with HS stretching and may have irritated knee a bit.  During therex we discussed that moderate/light intensity stretching throughout the day will be more beneficial than aggressive stretching for short durations.  I recommended following order for home: warm up on bike -> 10-15 min of TKE etc (quad activation with HS stretching), followed by LLLD stretch for ext with heat applied over HS attachment but avoiding completely encapsulating knee x3/day.  Pt agrees to plan.  Pt will continue to benefit from skilled physical therapy to address remaining deficits and achieve listed goals.  Continue per POC.    Personal Factors and Comorbidities Comorbidity 1    Comorbidities hx of CX    Examination-Activity Limitations Stand;Locomotion Level    Stability/Clinical Decision Making Stable/Uncomplicated    Rehab Potential Good    PT Frequency 2x / week    PT Duration 8 weeks    PT Treatment/Interventions ADLs/Self Care Home Management;Cryotherapy;Electrical Stimulation;Iontophoresis 4mg /ml Dexamethasone;Moist Heat;Ultrasound;Gait training;Stair training;Functional mobility training;Therapeutic activities;Therapeutic exercise;Balance training;Neuromuscular re-education;Patient/family education;Manual techniques;Taping;Dry  needling;Passive range of motion;Vasopneumatic Device    PT Next Visit Plan review/ update HEP PRN, knee ROM/ mobs, gross hip /knee strengthening ,gait training, vaso for swelling PRN    PT Home Exercise Plan [id number] - quad  set, heel slides with strap, SLR, sidelying hip abduction,    Consulted and Agree with Plan of Care Patient             Patient will benefit from skilled therapeutic intervention in order to improve the following deficits and impairments:  Pain, Improper body mechanics, Abnormal gait, Increased muscle spasms, Postural dysfunction, Decreased activity tolerance, Decreased endurance, Decreased balance, Decreased range of motion, Increased edema  Visit Diagnosis: Chronic pain of right knee  Other abnormalities of gait and mobility  Localized edema  Muscle weakness (generalized)     Problem List There are no problems to display for this patient.   Mathis Dad, PT 01/03/2021, 10:48 AM  [facility] [address] [place], [place], [address] Phone: [phone]   Fax:  [phone]  Name: Kadin Bera MRN: [id number] Date of Birth: 11/05/1955

## 2021-01-08 ENCOUNTER — Other Ambulatory Visit: Payer: Self-pay

## 2021-01-08 ENCOUNTER — Encounter: Payer: Self-pay | Admitting: Physical Therapy

## 2021-01-08 ENCOUNTER — Ambulatory Visit: Payer: Medicare Other | Admitting: Physical Therapy

## 2021-01-08 DIAGNOSIS — M25561 Pain in right knee: Secondary | ICD-10-CM

## 2021-01-08 DIAGNOSIS — R600 Localized edema: Secondary | ICD-10-CM

## 2021-01-08 DIAGNOSIS — G8929 Other chronic pain: Secondary | ICD-10-CM

## 2021-01-08 DIAGNOSIS — R2689 Other abnormalities of gait and mobility: Secondary | ICD-10-CM

## 2021-01-08 DIAGNOSIS — M6281 Muscle weakness (generalized): Secondary | ICD-10-CM

## 2021-01-08 NOTE — Therapy (Signed)
[place], [place], [address] Phone: [phone]   Fax:  [phone]  Physical Therapy Treatment  Patient Details  Name: Taylor Boyer MRN: [id number] Date of Birth: [birth year] Referring Provider (PT): Lesle Reek DO   Encounter Date: 01/08/2021   PT End of Session - 01/08/21 1415     Visit Number 5    Number of Visits 15    Date for PT Re-Evaluation 02/13/21    Authorization Type [facility] - FOTO 6th and 10 th vsit    Authorization - Visit Number 5    Authorization - Number of Visits 15    Progress Note Due on Visit 10    PT Start Time 1761    PT Stop Time 1458    PT Time Calculation (min) 43 min    Activity Tolerance Patient tolerated treatment well    Behavior During Therapy WFL for tasks assessed/performed             Past Medical History:  Diagnosis Date   Allergy    Arthritis    knees   Cancer ([place])    basal cell skin cancer   History of adenomatous polyp of colon    Hyperlipidemia    Hypertension    Migraines     Past Surgical History:  Procedure Laterality Date   BUNIONECTOMY Right    CHOLECYSTECTOMY     COLONOSCOPY     KNEE ARTHROSCOPY WITH LATERAL RELEASE Right    POLYPECTOMY     R tka Right 12/14/2020   ROTATOR CUFF REPAIR Right    TONSILLECTOMY      There were no vitals filed for this visit.   Subjective Assessment - 01/08/21 1416     Subjective " I feel like things are going well. I saw the MD and he was pleased with where every is at but to keep massaging the knee cap to reduce the stiffness."    Patient Stated Goals to get the knee better    Currently in Pain? Yes    Pain Score 7     Pain Location Knee    Pain Orientation Right    Pain Descriptors / Indicators Aching;Sore    Pain Type Chronic pain    Pain Onset More than a month ago    Pain Frequency Intermittent    Aggravating Factors  prolonged walking/ standing, bending the knee    Pain Relieving  Factors medication                [facility] PT Assessment - 01/08/21 0001       Assessment   Medical Diagnosis S/P R TKA    Referring Provider (PT) Lesle Reek DO      AROM   Right Knee Extension 17    Right Knee Flexion 104                      [facility] Adult PT Treatment/Exercise:  Therapeutic Exercise: L1 x x 5 min - lowering bike seat at 2 min for added knee flexion Slant board calf stretch 2 x 30 sec  SLR combined with quad set 2 x 12 Hamstring stretch 2 x 30 sec TKE against wall with ball behind knee 1 x 10 holding 3 sec with R lateral weight shift LAQ 2 x 12  Manual Therapy: Tibial AP mobs grade III with ER to promote screw home mechanism Tack and stretch of the hamstrings Tibiofemoral AP grade III  with active flexion  Neuromuscular re-ed: N/A  Therapeutic Activity: Gait training heel strike / toe off 2 x 40 ft  Modalities: N/A  Self Care: N/A  Consider / progression for next session:                 PT Short Term Goals - 12/19/20 1556       PT SHORT TERM GOAL #1   Title pt to be IND with initial HEP    Time 4    Period Weeks    Status New    Target Date 01/16/21      PT SHORT TERM GOAL #2   Title increaes knee flexion to >/= 110 degrees, and extension to </= 10 degress for therapuetic progression    Time 4    Period Weeks    Status New    Target Date 01/16/21      PT SHORT TERM GOAL #3   Title pt to verbalize/ demo efficient gait with heel strike/ toe off  with LRAD for functional mobility    Time 4    Period Weeks    Status New    Target Date 01/16/21               PT Long Term Goals - 12/19/20 1636       PT LONG TERM GOAL #1   Title increase R knee active total arc ROM to >/= 5 to 120 with </= 2/10 max pain for funtional ROM required for ADLs    Time 8    Period Weeks    Status New    Target Date 02/13/21      PT LONG TERM GOAL #2   Title increase RLE gross strength to >/= 4+/5 to promote knee  stability with walking/ standing    Time 8    Period Weeks    Status New    Target Date 02/13/21      PT LONG TERM GOAL #3   Title pt to be able to walk/ stand for >/= 60 min and navigate up/ down >/= 12 steps reciprocally with LRAD for functional mobility/ endurance required for in home and community amb    Time 8    Period Weeks    Status New    Target Date 02/13/21      PT LONG TERM GOAL #4   Title increase FOTO score to >/= 56% to demo improvement in function    Time 8    Period Weeks    Status New    Target Date 02/13/21      PT LONG TERM GOAL #5   Title pt to be IND with all HEP to maintain and progress her current LFO IND    Time 8    Period Weeks    Status New    Target Date 02/13/21                   Plan - 01/08/21 1440     Clinical Impression Statement pt arrives to session noting continued elevated pain inthe R knee between 6-7/10. her arc of ROM today was    Examination-Activity Limitations Stand;Locomotion Level    PT Treatment/Interventions ADLs/Self Care Home Management;Cryotherapy;Electrical Stimulation;Iontophoresis 4mg /ml Dexamethasone;Moist Heat;Ultrasound;Gait training;Stair training;Functional mobility training;Therapeutic activities;Therapeutic exercise;Balance training;Neuromuscular re-education;Patient/family education;Manual techniques;Taping;Dry needling;Passive range of motion;Vasopneumatic Device    PT Next Visit Plan review/ update HEP PRN, knee ROM/ mobs, gross hip /knee strengthening ,gait training, vaso for swelling PRN    PT  Home Exercise Plan [id number] - quad set, heel slides with strap, SLR, sidelying hip abduction,             Patient will benefit from skilled therapeutic intervention in order to improve the following deficits and impairments:  Pain, Improper body mechanics, Abnormal gait, Increased muscle spasms, Postural dysfunction, Decreased activity tolerance, Decreased endurance, Decreased balance, Decreased range of  motion, Increased edema  Visit Diagnosis: No diagnosis found.     Problem List There are no problems to display for this patient.  [name] PT, DPT, LAT, ATC  01/08/21  3:00 PM      [facility] [address] [place], [place], [address] Phone: [phone]   Fax:  [phone]  Name: Taylor Boyer MRN: [id number] Date of Birth: August 19, 1955

## 2021-01-10 ENCOUNTER — Other Ambulatory Visit: Payer: Self-pay

## 2021-01-10 ENCOUNTER — Ambulatory Visit: Payer: Medicare Other | Admitting: Physical Therapy

## 2021-01-10 ENCOUNTER — Encounter: Payer: Self-pay | Admitting: Physical Therapy

## 2021-01-10 DIAGNOSIS — M6281 Muscle weakness (generalized): Secondary | ICD-10-CM

## 2021-01-10 DIAGNOSIS — G8929 Other chronic pain: Secondary | ICD-10-CM

## 2021-01-10 DIAGNOSIS — R600 Localized edema: Secondary | ICD-10-CM

## 2021-01-10 DIAGNOSIS — M25561 Pain in right knee: Secondary | ICD-10-CM | POA: Diagnosis not present

## 2021-01-10 DIAGNOSIS — R2689 Other abnormalities of gait and mobility: Secondary | ICD-10-CM

## 2021-01-10 NOTE — Therapy (Addendum)
[place], [place], [address] Phone: [phone]   Fax:  [phone]  Physical Therapy Treatment  Patient Details  Name: Taylor Boyer MRN: [id number] Date of Birth: [birth year] Referring Provider (PT): Lesle Reek DO   Encounter Date: 01/10/2021   PT End of Session - 01/10/21 1300     Visit Number 6    Number of Visits 15    Date for PT Re-Evaluation 02/13/21    Authorization Type VA - FOTO 6th and 10 th vsit    Authorization - Visit Number 6    Authorization - Number of Visits 15    Progress Note Due on Visit 10    Activity Tolerance Patient tolerated treatment well    Behavior During Therapy [facility] for tasks assessed/performed           Time in: 1:00p - 1:45p Total time 45 min  Past Medical History:  Diagnosis Date   Allergy    Arthritis    knees   Cancer ([place])    basal cell skin cancer   History of adenomatous polyp of colon    Hyperlipidemia    Hypertension    Migraines     Past Surgical History:  Procedure Laterality Date   BUNIONECTOMY Right    CHOLECYSTECTOMY     COLONOSCOPY     KNEE ARTHROSCOPY WITH LATERAL RELEASE Right    POLYPECTOMY     R tka Right 12/14/2020   ROTATOR CUFF REPAIR Right    TONSILLECTOMY      There were no vitals filed for this visit.   Subjective Assessment - 01/10/21 1300     Subjective Pt reports that the stretches from last visit were uncomfortable, but helpful.  5/10 R knee pain "ache".    Limitations Standing;Walking    Pain Onset More than a month ago             Ext ROM 11->10 (after manual) Flexion 104 -> 108 (after manual)   [facility] Adult PT Treatment/Exercise:   Therapeutic Exercise: - Bike while taking subjective and planning session with patient - Leg press - uni - 3x10 @ 20# with concentration on ext   Manual Therapy: Tibial AP mobs grade III with ER to promote screw home mechanism, knee flexed, pt in supine (little  change in flexion) Seated knee flexion mobilization with towel roll under distal femur (104->108) Seated knee ext mobilization (AP distal femur)     PT Short Term Goals - 12/19/20 1556       PT SHORT TERM GOAL #1   Title pt to be IND with initial HEP    Time 4    Period Weeks    Status New    Target Date 01/16/21      PT SHORT TERM GOAL #2   Title increaes knee flexion to >/= 110 degrees, and extension to </= 10 degress for therapuetic progression    Time 4    Period Weeks    Status New    Target Date 01/16/21      PT SHORT TERM GOAL #3   Title pt to verbalize/ demo efficient gait with heel strike/ toe off  with LRAD for functional mobility    Time 4    Period Weeks    Status New    Target Date 01/16/21               PT Long Term Goals - 12/19/20 1636  PT LONG TERM GOAL #1   Title increase R knee active total arc ROM to >/= 5 to 120 with </= 2/10 max pain for funtional ROM required for ADLs    Time 8    Period Weeks    Status New    Target Date 02/13/21      PT LONG TERM GOAL #2   Title increase RLE gross strength to >/= 4+/5 to promote knee stability with walking/ standing    Time 8    Period Weeks    Status New    Target Date 02/13/21      PT LONG TERM GOAL #3   Title pt to be able to walk/ stand for >/= 60 min and navigate up/ down >/= 12 steps reciprocally with LRAD for functional mobility/ endurance required for in home and community amb    Time 8    Period Weeks    Status New    Target Date 02/13/21      PT LONG TERM GOAL #4   Title increase FOTO score to >/= 56% to demo improvement in function    Time 8    Period Weeks    Status New    Target Date 02/13/21      PT LONG TERM GOAL #5   Title pt to be IND with all HEP to maintain and progress her current LFO IND    Time 8    Period Weeks    Status New    Target Date 02/13/21                   Plan - 01/10/21 1349     Clinical Impression Statement Pt reports a mild  increase in pain following therapy  HEP was reviewed, but left unchanged    Overall, [place] Taylor Boyer is progressing well with therapy.  Today we concentrated on knee range of motion.  Pt shows significant improvement in knee flexion ROM with MT.  Knee ext ROM shows little change with manual.  Pt continues to have lateral HS/Gastroc pain which limits ext.  I encouraged her to continue self TPR at home and continue CC knee ext activities.  Pt will continue to benefit from skilled physical therapy to address remaining deficits and achieve listed goals.  Continue per POC.    Examination-Activity Limitations Stand;Locomotion Level    PT Treatment/Interventions ADLs/Self Care Home Management;Cryotherapy;Electrical Stimulation;Iontophoresis 4mg /ml Dexamethasone;Moist Heat;Ultrasound;Gait training;Stair training;Functional mobility training;Therapeutic activities;Therapeutic exercise;Balance training;Neuromuscular re-education;Patient/family education;Manual techniques;Taping;Dry needling;Passive range of motion;Vasopneumatic Device    PT Next Visit Plan review/ update HEP PRN, knee ROM/ mobs, gross hip /knee strengthening ,gait training, vaso for swelling PRN    PT Home Exercise Plan [id number] - quad set, heel slides with strap, SLR, sidelying hip abduction,             Patient will benefit from skilled therapeutic intervention in order to improve the following deficits and impairments:  Pain, Improper body mechanics, Abnormal gait, Increased muscle spasms, Postural dysfunction, Decreased activity tolerance, Decreased endurance, Decreased balance, Decreased range of motion, Increased edema  Visit Diagnosis: Chronic pain of right knee  Other abnormalities of gait and mobility  Localized edema  Muscle weakness (generalized)     Problem List There are no problems to display for this patient.   Mathis Dad, PT 01/10/2021, 1:50 PM  [place], [place], [address] Phone: [phone]   Fax:  [phone]  Name: Taylor Boyer  Taylor Boyer MRN: [id number] Date of Birth: [birth year]

## 2021-01-17 ENCOUNTER — Encounter: Payer: Self-pay | Admitting: Physical Therapy

## 2021-01-17 ENCOUNTER — Other Ambulatory Visit: Payer: Self-pay

## 2021-01-17 ENCOUNTER — Ambulatory Visit: Payer: Medicare Other | Attending: Surgery | Admitting: Physical Therapy

## 2021-01-17 DIAGNOSIS — M6281 Muscle weakness (generalized): Secondary | ICD-10-CM | POA: Diagnosis present

## 2021-01-17 DIAGNOSIS — G8929 Other chronic pain: Secondary | ICD-10-CM

## 2021-01-17 DIAGNOSIS — M25561 Pain in right knee: Secondary | ICD-10-CM | POA: Insufficient documentation

## 2021-01-17 DIAGNOSIS — R2689 Other abnormalities of gait and mobility: Secondary | ICD-10-CM

## 2021-01-17 DIAGNOSIS — R600 Localized edema: Secondary | ICD-10-CM | POA: Diagnosis present

## 2021-01-17 NOTE — Therapy (Signed)
[place], [place], [address] Phone: [phone]   Fax:  [phone]  Physical Therapy Treatment  Patient Details  Name: Taylor Boyer MRN: [id number] Date of Birth: 06/19/1955 Referring Provider (PT): Lesle Reek DO   Encounter Date: 01/17/2021   PT End of Session - 01/17/21 1613     Visit Number 7    Number of Visits 15    Date for PT Re-Evaluation 02/13/21    Authorization Type VA - FOTO 6th and 10 th vsit    Authorization - Visit Number 7    Authorization - Number of Visits 15    Progress Note Due on Visit 10    PT Start Time 1616    PT Stop Time 1700    PT Time Calculation (min) 44 min    Activity Tolerance Patient tolerated treatment well    Behavior During Therapy [facility] for tasks assessed/performed             Past Medical History:  Diagnosis Date   Allergy    Arthritis    knees   Cancer ([place])    basal cell skin cancer   History of adenomatous polyp of colon    Hyperlipidemia    Hypertension    Migraines     Past Surgical History:  Procedure Laterality Date   BUNIONECTOMY Right    CHOLECYSTECTOMY     COLONOSCOPY     KNEE ARTHROSCOPY WITH LATERAL RELEASE Right    POLYPECTOMY     R tka Right 12/14/2020   ROTATOR CUFF REPAIR Right    TONSILLECTOMY      There were no vitals filed for this visit.   Subjective Assessment - 01/17/21 1620     Subjective Pt reports that she has been riding her bike daily at home.  She has been having a popping sensation in her R posterior knee.  5/10 R knee pain "ache".    Limitations Standing;Walking    Pain Onset More than a month ago                [facility] PT Assessment - 01/17/21 0001       AROM   Right Knee Extension 10   with OP   Right Knee Flexion 105             [facility] Adult PT Treatment/Exercise:   Therapeutic Exercise: - Elliptical while taking subjective and planning session with patient - TKE with purple  pull up band - 3x15 (NT) - TKE with ball on wall - 3x15 (NT) - Step up forward (with contralateral march) and lateral 15x ea (emphasis on ext) - Slant board stretch - 2x45'' - wall squat - 2x7 ~80 degrees - Leg press - 25# - 3x10  Manual Therapy : Seated knee flexion mobilization with towel roll under distal femur Seated knee ext mobilization (AP distal femur)  Patient Education: - HEP was updated and reissued to patient; pt educated on HEP, was provided handout, and verbally confirmed understanding of exercises.       PT Short Term Goals - 12/19/20 1556       PT SHORT TERM GOAL #1   Title pt to be IND with initial HEP    Time 4    Period Weeks    Status New    Target Date 01/16/21      PT SHORT TERM GOAL #2   Title increaes knee flexion to >/= 110 degrees, and extension to </=  10 degress for therapuetic progression    Time 4    Period Weeks    Status New    Target Date 01/16/21      PT SHORT TERM GOAL #3   Title pt to verbalize/ demo efficient gait with heel strike/ toe off  with LRAD for functional mobility    Time 4    Period Weeks    Status New    Target Date 01/16/21               PT Long Term Goals - 12/19/20 1636       PT LONG TERM GOAL #1   Title increase R knee active total arc ROM to >/= 5 to 120 with </= 2/10 max pain for funtional ROM required for ADLs    Time 8    Period Weeks    Status New    Target Date 02/13/21      PT LONG TERM GOAL #2   Title increase RLE gross strength to >/= 4+/5 to promote knee stability with walking/ standing    Time 8    Period Weeks    Status New    Target Date 02/13/21      PT LONG TERM GOAL #3   Title pt to be able to walk/ stand for >/= 60 min and navigate up/ down >/= 12 steps reciprocally with LRAD for functional mobility/ endurance required for in home and community amb    Time 8    Period Weeks    Status New    Target Date 02/13/21      PT LONG TERM GOAL #4   Title increase FOTO score to >/= 56%  to demo improvement in function    Time 8    Period Weeks    Status New    Target Date 02/13/21      PT LONG TERM GOAL #5   Title pt to be IND with all HEP to maintain and progress her current LFO IND    Time 8    Period Weeks    Status New    Target Date 02/13/21                   Plan - 01/17/21 1709     Clinical Impression Statement Pt reports a mild increase in pain following therapy.  Overall, Taylor Boyer is progressing well with therapy.  Today we concentrated on quad strengthening, hip strengthening, and knee range of motion.  Pt continues to demonstrate recalcitrant knee ext deficit with firm end feel.  I encouraged her to continue LLLD stretching at home and functional knee ext.  We will continue with some manual therapy but begin to focus on gait, balance, and strength as well.  Pt will continue to benefit from skilled physical therapy to address remaining deficits and achieve listed goals.  Continue per POC.    Examination-Activity Limitations Stand;Locomotion Level    PT Treatment/Interventions ADLs/Self Care Home Management;Cryotherapy;Electrical Stimulation;Iontophoresis 4mg /ml Dexamethasone;Moist Heat;Ultrasound;Gait training;Stair training;Functional mobility training;Therapeutic activities;Therapeutic exercise;Balance training;Neuromuscular re-education;Patient/family education;Manual techniques;Taping;Dry needling;Passive range of motion;Vasopneumatic Device    PT Next Visit Plan review/ update HEP PRN, knee ROM/ mobs, gross hip /knee strengthening ,gait training, vaso for swelling PRN    PT Home Exercise Plan [id number] - quad set, heel slides with strap, SLR, sidelying hip abduction,             Patient will benefit from skilled therapeutic intervention in order to improve the following deficits  and impairments:  Pain, Improper body mechanics, Abnormal gait, Increased muscle spasms, Postural dysfunction, Decreased activity tolerance, Decreased  endurance, Decreased balance, Decreased range of motion, Increased edema  Visit Diagnosis: Chronic pain of right knee  Other abnormalities of gait and mobility  Localized edema  Muscle weakness (generalized)     Problem List There are no problems to display for this patient.   Mathis Dad, PT 01/17/2021, 5:10 PM  [facility] [address] [place], [place], [address] Phone: [phone]   Fax:  [phone]  Name: Taylor Boyer MRN: [id number] Date of Birth: Dec 05, 1955

## 2021-01-17 NOTE — Patient Instructions (Signed)
Access Code: [email or web address] URL: [email or web address] Date: 01/17/2021 Prepared by: Shearon Balo  Exercises Supine Heel Slide with Strap - 1 x daily - 7 x weekly - 2 sets - 10 reps - 5 seconds hold Sidelying Hip Abduction - 1 x daily - 7 x weekly - 2 sets - 10 reps Seated Hamstring Stretch - 1 x daily - 7 x weekly - 2 sets - 2 reps - 30 hold Standing Terminal Knee Extension with Resistance - 1 x daily - 7 x weekly - 3 sets - 10 reps Seated Knee Extension Stretch with Chair - 1 x daily - 7 x weekly - 1 sets - 1 reps - 60 min total/day hold Gastroc Stretch on Wall - 1 x daily - 7 x weekly - 3 sets - 10 reps Runner's Step Up/Down - 1 x daily - 7 x weekly - 3 sets - 10 reps

## 2021-01-23 ENCOUNTER — Other Ambulatory Visit: Payer: Self-pay

## 2021-01-23 ENCOUNTER — Encounter: Payer: Self-pay | Admitting: Physical Therapy

## 2021-01-23 ENCOUNTER — Ambulatory Visit: Payer: Medicare Other | Admitting: Physical Therapy

## 2021-01-23 DIAGNOSIS — G8929 Other chronic pain: Secondary | ICD-10-CM

## 2021-01-23 DIAGNOSIS — M25561 Pain in right knee: Secondary | ICD-10-CM

## 2021-01-23 DIAGNOSIS — R2689 Other abnormalities of gait and mobility: Secondary | ICD-10-CM

## 2021-01-23 DIAGNOSIS — R600 Localized edema: Secondary | ICD-10-CM

## 2021-01-23 DIAGNOSIS — M6281 Muscle weakness (generalized): Secondary | ICD-10-CM

## 2021-01-23 NOTE — Therapy (Signed)
[place], [place], [address] Phone: [phone]   Fax:  [phone]  Physical Therapy Treatment  Patient Details  Name: Taylor Boyer MRN: [id number] Date of Birth: [birth year] Referring Provider (PT): Lesle Reek DO   Encounter Date: 01/23/2021   PT End of Session - 01/23/21 Taylor Boyer     Visit Number 8    Number of Visits 15    Date for PT Re-Evaluation 02/13/21    Authorization Type VA - FOTO 6th and 10 th vsit    Authorization - Visit Number 8    Authorization - Number of Visits 15    Progress Note Due on Visit 10    PT Start Time 1830    PT Stop Time 1910    PT Time Calculation (min) 40 min    Activity Tolerance Patient tolerated treatment well    Behavior During Therapy [facility] for tasks assessed/performed             Past Medical History:  Diagnosis Date   Allergy    Arthritis    knees   Cancer ([place])    basal cell skin cancer   History of adenomatous polyp of colon    Hyperlipidemia    Hypertension    Migraines     Past Surgical History:  Procedure Laterality Date   BUNIONECTOMY Right    CHOLECYSTECTOMY     COLONOSCOPY     KNEE ARTHROSCOPY WITH LATERAL RELEASE Right    POLYPECTOMY     R tka Right 12/14/2020   ROTATOR CUFF REPAIR Right    TONSILLECTOMY      There were no vitals filed for this visit.   Subjective Assessment - 01/23/21 1832     Subjective She has been completing LLLD stretches at home and riding her bikes.  She feels like things are going well.  4/10 R knee pain "ache".    Limitations Standing;Walking    Pain Onset More than a month ago                [facility] PT Assessment - 01/24/21 0001       AROM   Right Knee Extension 10              [facility] Adult PT Treatment/Exercise:   Therapeutic Exercise: - Elliptical while taking subjective and planning session with patient - Step up forward (with contralateral march) and lateral 15x ea (emphasis  on ext) - Slant board stretch - 2x45'' - Prone HS curl - 7.5# - 3x10 - Prone quad stretch - 30'' x33 - wall squat - 2x7 ~80 degrees - Leg press - 25# - 3x10 - 2x10 global ext stretch on wall (towel slide) - quad set into swiss ball - 20x - kick into black TB in supine - 20x - pball kick in supine - 3x10 - Hip hinge - 4x10 - 25# for weighted HS stretch      PT Short Term Goals - 12/19/20 1556       PT SHORT TERM GOAL #1   Title pt to be IND with initial HEP    Time 4    Period Weeks    Status New    Target Date 01/16/21      PT SHORT TERM GOAL #2   Title increaes knee flexion to >/= 110 degrees, and extension to </= 10 degress for therapuetic progression    Time 4    Period Weeks    Status  New    Target Date 01/16/21      PT SHORT TERM GOAL #3   Title pt to verbalize/ demo efficient gait with heel strike/ toe off  with LRAD for functional mobility    Time 4    Period Weeks    Status New    Target Date 01/16/21               PT Long Term Goals - 12/19/20 1636       PT LONG TERM GOAL #1   Title increase R knee active total arc ROM to >/= 5 to 120 with </= 2/10 max pain for funtional ROM required for ADLs    Time 8    Period Weeks    Status New    Target Date 02/13/21      PT LONG TERM GOAL #2   Title increase RLE gross strength to >/= 4+/5 to promote knee stability with walking/ standing    Time 8    Period Weeks    Status New    Target Date 02/13/21      PT LONG TERM GOAL #3   Title pt to be able to walk/ stand for >/= 60 min and navigate up/ down >/= 12 steps reciprocally with LRAD for functional mobility/ endurance required for in home and community amb    Time 8    Period Weeks    Status New    Target Date 02/13/21      PT LONG TERM GOAL #4   Title increase FOTO score to >/= 56% to demo improvement in function    Time 8    Period Weeks    Status New    Target Date 02/13/21      PT LONG TERM GOAL #5   Title pt to be IND with all HEP to  maintain and progress her current LFO IND    Time 8    Period Weeks    Status New    Target Date 02/13/21                   Plan - 01/24/21 0917     Clinical Impression Statement Overall, Taylor Boyer is progressing well with therapy.  Pt reports no increase in baseline pain following therapy.  Today we concentrated on functional strengthening and knee range of motion.  Pt continues to show knee ext deficit.  She also has some rec fem tightness.  At this point I feel like she is doing everything she can to improve knee ext, but ext has a very firm end feel.  Pt will continue to benefit from skilled physical therapy to address remaining deficits and achieve listed goals.  Continue per POC.    Examination-Activity Limitations Stand;Locomotion Level    PT Treatment/Interventions ADLs/Self Care Home Management;Cryotherapy;Electrical Stimulation;Iontophoresis 4mg /ml Dexamethasone;Moist Heat;Ultrasound;Gait training;Stair training;Functional mobility training;Therapeutic activities;Therapeutic exercise;Balance training;Neuromuscular re-education;Patient/family education;Manual techniques;Taping;Dry needling;Passive range of motion;Vasopneumatic Device    PT Next Visit Plan review/ update HEP PRN, knee ROM/ mobs, gross hip /knee strengthening ,gait training, vaso for swelling PRN    PT Home Exercise Plan [id number] - quad set, heel slides with strap, SLR, sidelying hip abduction,             Patient will benefit from skilled therapeutic intervention in order to improve the following deficits and impairments:  Pain, Improper body mechanics, Abnormal gait, Increased muscle spasms, Postural dysfunction, Decreased activity tolerance, Decreased endurance, Decreased balance, Decreased range of motion, Increased edema  Visit  Diagnosis: Chronic pain of right knee  Other abnormalities of gait and mobility  Localized edema  Muscle weakness (generalized)     Problem List There are no problems to  display for this patient.   Mathis Dad, PT 01/24/2021, 9:18 AM  [facility] [address] [place], [place], [address] Phone: [phone]   Fax:  [phone]  Name: Taylor Boyer MRN: [id number] Date of Birth: [birth year]

## 2021-01-29 ENCOUNTER — Other Ambulatory Visit: Payer: Self-pay

## 2021-01-29 ENCOUNTER — Ambulatory Visit: Payer: Medicare Other | Admitting: Physical Therapy

## 2021-01-29 ENCOUNTER — Encounter: Payer: Self-pay | Admitting: Physical Therapy

## 2021-01-29 DIAGNOSIS — M25561 Pain in right knee: Secondary | ICD-10-CM | POA: Diagnosis not present

## 2021-01-29 DIAGNOSIS — M6281 Muscle weakness (generalized): Secondary | ICD-10-CM

## 2021-01-29 DIAGNOSIS — R2689 Other abnormalities of gait and mobility: Secondary | ICD-10-CM

## 2021-01-29 DIAGNOSIS — G8929 Other chronic pain: Secondary | ICD-10-CM

## 2021-01-29 DIAGNOSIS — R600 Localized edema: Secondary | ICD-10-CM

## 2021-01-29 NOTE — Therapy (Signed)
[place], [place], [address] Phone: [phone]   Fax:  [phone]  Physical Therapy Treatment  Patient Details  Name: Taylor Boyer MRN: [id number] Date of Birth: Oct 07, 1955 Referring Provider (PT): Lesle Reek DO   Encounter Date: 01/29/2021   PT End of Session - 01/29/21 1300     Visit Number 9    Number of Visits 15    Date for PT Re-Evaluation 02/13/21    Authorization Type VA - FOTO 6th and 10 th vsit    Authorization - Visit Number 9    Authorization - Number of Visits 15    Progress Note Due on Visit 10    PT Start Time 1300    PT Stop Time 1345    PT Time Calculation (min) 45 min    Activity Tolerance Patient tolerated treatment well    Behavior During Therapy WFL for tasks assessed/performed             Past Medical History:  Diagnosis Date   Allergy    Arthritis    knees   Cancer ([place])    basal cell skin cancer   History of adenomatous polyp of colon    Hyperlipidemia    Hypertension    Migraines     Past Surgical History:  Procedure Laterality Date   BUNIONECTOMY Right    CHOLECYSTECTOMY     COLONOSCOPY     KNEE ARTHROSCOPY WITH LATERAL RELEASE Right    POLYPECTOMY     R tka Right 12/14/2020   ROTATOR CUFF REPAIR Right    TONSILLECTOMY      There were no vitals filed for this visit.   Subjective Assessment - 01/29/21 1306     Subjective Pt reports that she has been HEP compliant.  She has been approved for a knee ext device.  4/10 R knee pain "ache".    Limitations Standing;Walking    Pain Onset More than a month ago                [facility] PT Assessment - 01/29/21 0001       AROM   Right Knee Extension 9             Objective: 9-105 knee ROM  [facility] Adult PT Treatment/Exercise:   Therapeutic Exercise: - Elliptical while taking subjective and planning session with patient - Step up forward  8'' (with contralateral march) and lateral  2x10 (emphasis on ext) - Slant board stretch - 2x45'' - Prone HS curl - 10# - 3x10 - Eccentric HS/quad with manual resistance x10 - Prone quad stretch - 30'' x3 - wall squat - 15'' x5 - swiss ball bridge with HS curl - 2x10  Not today: - Hip hinge - 4x10 - 25# for weighted HS stretch (add back in)     PT Short Term Goals - 01/29/21 1340       PT SHORT TERM GOAL #1   Title pt to be IND with initial HEP    Time 4    Period Weeks    Status Achieved    Target Date 01/16/21      PT SHORT TERM GOAL #2   Title increaes knee flexion to >/= 110 degrees, and extension to </= 10 degress for therapuetic progression    Baseline 11/15: 9-105    Time 4    Period Weeks    Status Partially Met    Target Date 01/16/21  PT SHORT TERM GOAL #3   Title pt to verbalize/ demo efficient gait with heel strike/ toe off  with LRAD for functional mobility    Baseline 11/15: MET    Time 4    Period Weeks    Status New    Target Date 01/16/21               PT Long Term Goals - 12/19/20 1636       PT LONG TERM GOAL #1   Title increase R knee active total arc ROM to >/= 5 to 120 with </= 2/10 max pain for funtional ROM required for ADLs    Time 8    Period Weeks    Status New    Target Date 02/13/21      PT LONG TERM GOAL #2   Title increase RLE gross strength to >/= 4+/5 to promote knee stability with walking/ standing    Time 8    Period Weeks    Status New    Target Date 02/13/21      PT LONG TERM GOAL #3   Title pt to be able to walk/ stand for >/= 60 min and navigate up/ down >/= 12 steps reciprocally with LRAD for functional mobility/ endurance required for in home and community amb    Time 8    Period Weeks    Status New    Target Date 02/13/21      PT LONG TERM GOAL #4   Title increase FOTO score to >/= 56% to demo improvement in function    Time 8    Period Weeks    Status New    Target Date 02/13/21      PT LONG TERM GOAL #5   Title pt to be IND with all HEP  to maintain and progress her current LFO IND    Time 8    Period Weeks    Status New    Target Date 02/13/21                   Plan - 01/29/21 1353     Clinical Impression Statement Overall, Taylor Boyer is progressing well with therapy.  Pt reports a mild increase in pain following therapy.  Today we concentrated on knee strengthening and knee range of motion.  Pt showing slight improvement in knee ext ROM today.  We added in some HS eccentrics to improve HS strength and length to good effect.  Pt will continue to benefit from skilled physical therapy to address remaining deficits and achieve listed goals.  Continue per POC.    Examination-Activity Limitations Stand;Locomotion Level    PT Treatment/Interventions ADLs/Self Care Home Management;Cryotherapy;Electrical Stimulation;Iontophoresis 61m/ml Dexamethasone;Moist Heat;Ultrasound;Gait training;Stair training;Functional mobility training;Therapeutic activities;Therapeutic exercise;Balance training;Neuromuscular re-education;Patient/family education;Manual techniques;Taping;Dry needling;Passive range of motion;Vasopneumatic Device    PT Next Visit Plan review/ update HEP PRN, knee ROM/ mobs, gross hip /knee strengthening ,gait training, vaso for swelling PRN    PT Home Exercise Plan [id number]- quad set, heel slides with strap, SLR, sidelying hip abduction,             Patient will benefit from skilled therapeutic intervention in order to improve the following deficits and impairments:  Pain, Improper body mechanics, Abnormal gait, Increased muscle spasms, Postural dysfunction, Decreased activity tolerance, Decreased endurance, Decreased balance, Decreased range of motion, Increased edema  Visit Diagnosis: Chronic pain of right knee  Other abnormalities of gait and mobility  Localized edema  Muscle weakness (generalized)  Problem List There are no problems to display for this patient.   Mathis Dad,  PT 01/29/2021, 1:54 PM  [facility] [address] [place], [place], [address] Phone: [phone]   Fax:  [phone]  Name: Taylor Boyer MRN: [id number] Date of Birth: 10/08/1955

## 2021-01-31 ENCOUNTER — Other Ambulatory Visit: Payer: Self-pay

## 2021-01-31 ENCOUNTER — Ambulatory Visit: Payer: Medicare Other | Admitting: Physical Therapy

## 2021-01-31 ENCOUNTER — Encounter: Payer: Self-pay | Admitting: Physical Therapy

## 2021-01-31 DIAGNOSIS — R2689 Other abnormalities of gait and mobility: Secondary | ICD-10-CM

## 2021-01-31 DIAGNOSIS — G8929 Other chronic pain: Secondary | ICD-10-CM

## 2021-01-31 DIAGNOSIS — M25561 Pain in right knee: Secondary | ICD-10-CM | POA: Diagnosis not present

## 2021-01-31 NOTE — Therapy (Signed)
[place], [place], [address] Phone: [phone]   Fax:  [phone]  Physical Therapy Treatment  Patient Details  Name: Taylor Boyer MRN: [id number] Date of Birth: Oct 26, 1955 Referring Provider (PT): Lesle Reek DO   Encounter Date: 01/31/2021   PT End of Session - 01/31/21 1259     Visit Number 10    Number of Visits 15    Date for PT Re-Evaluation 02/13/21    Authorization Type VA - FOTO 6th and 10 th vsit    Authorization - Visit Number 10    Authorization - Number of Visits 15    Progress Note Due on Visit 10    PT Start Time 1300    PT Stop Time 1345    PT Time Calculation (min) 45 min    Activity Tolerance Patient tolerated treatment well    Behavior During Therapy WFL for tasks assessed/performed             Past Medical History:  Diagnosis Date   Allergy    Arthritis    knees   Cancer ([place])    basal cell skin cancer   History of adenomatous polyp of colon    Hyperlipidemia    Hypertension    Migraines     Past Surgical History:  Procedure Laterality Date   BUNIONECTOMY Right    CHOLECYSTECTOMY     COLONOSCOPY     KNEE ARTHROSCOPY WITH LATERAL RELEASE Right    POLYPECTOMY     R tka Right 12/14/2020   ROTATOR CUFF REPAIR Right    TONSILLECTOMY      There were no vitals filed for this visit.   Subjective Assessment - 01/31/21 1304     Subjective Pt recieved a device for her ext ROM.  She has used it 1x.  5/10 R knee pain "ache".    Limitations Standing;Walking    Pain Onset More than a month ago              Therapeutic Exercise:   - recumbent bike 5 min for warm up while taking subjective - step up 8'' fwd and lat with concentration on ext with contralateral march - 2x10 ea - holding TRX - retro lunge using TRX - 3x10 - lateral lunge using TRX - 3x10 - unilateral heel raise TRX - 2x10  - HS curl - 3x10 _0 # - 2 down 1 up - RDL - with TRX uni  support - 2x10 - wall squat - 30''x4   Neuromuscular re-ed, to improve balance and reduce fall risk: - SLS on foam - 30'' x4      PT Short Term Goals - 01/29/21 1340       PT SHORT TERM GOAL #1   Title pt to be IND with initial HEP    Time 4    Period Weeks    Status Achieved    Target Date 01/16/21      PT SHORT TERM GOAL #2   Title increaes knee flexion to >/= 110 degrees, and extension to </= 10 degress for therapuetic progression    Baseline 11/15: 9-105    Time 4    Period Weeks    Status Partially Met    Target Date 01/16/21      PT SHORT TERM GOAL #3   Title pt to verbalize/ demo efficient gait with heel strike/ toe off  with LRAD for functional mobility    Baseline 11/15: MET  Time 4    Period Weeks    Status New    Target Date 01/16/21               PT Long Term Goals - 12/19/20 1636       PT LONG TERM GOAL #1   Title increase R knee active total arc ROM to >/= 5 to 120 with </= 2/10 max pain for funtional ROM required for ADLs    Time 8    Period Weeks    Status New    Target Date 02/13/21      PT LONG TERM GOAL #2   Title increase RLE gross strength to >/= 4+/5 to promote knee stability with walking/ standing    Time 8    Period Weeks    Status New    Target Date 02/13/21      PT LONG TERM GOAL #3   Title pt to be able to walk/ stand for >/= 60 min and navigate up/ down >/= 12 steps reciprocally with LRAD for functional mobility/ endurance required for in home and community amb    Time 8    Period Weeks    Status New    Target Date 02/13/21      PT LONG TERM GOAL #4   Title increase FOTO score to >/= 56% to demo improvement in function    Time 8    Period Weeks    Status New    Target Date 02/13/21      PT LONG TERM GOAL #5   Title pt to be IND with all HEP to maintain and progress her current LFO IND    Time 8    Period Weeks    Status New    Target Date 02/13/21                   Plan - 01/31/21 1337      Clinical Impression Statement Overall, Taylor Boyer is progressing fair with therapy.  Pt reports a mild increase in pain following therapy.  Today we concentrated on knee strengthening and knee range of motion.  Pt continues to show knee ext deficit.  She did receive a knee ext machine from the [place] which she will begin using at home.  Her strength is progressing as expected and we concentrated on a bit more strength today including eccentric HS curl and RDL to improve HS length which seems to be the limiting factor of knee ext at this point.  May trial TDN for HS.  Pt will continue to benefit from skilled physical therapy to address remaining deficits and achieve listed goals.  Continue per POC.    Examination-Activity Limitations Stand;Locomotion Level    PT Treatment/Interventions ADLs/Self Care Home Management;Cryotherapy;Electrical Stimulation;Iontophoresis 104m/ml Dexamethasone;Moist Heat;Ultrasound;Gait training;Stair training;Functional mobility training;Therapeutic activities;Therapeutic exercise;Balance training;Neuromuscular re-education;Patient/family education;Manual techniques;Taping;Dry needling;Passive range of motion;Vasopneumatic Device    PT Next Visit Plan review/ update HEP PRN, knee ROM/ mobs, gross hip /knee strengthening ,gait training, vaso for swelling PRN    PT Home Exercise Plan [id number]- quad set, heel slides with strap, SLR, sidelying hip abduction,             Patient will benefit from skilled therapeutic intervention in order to improve the following deficits and impairments:  Pain, Improper body mechanics, Abnormal gait, Increased muscle spasms, Postural dysfunction, Decreased activity tolerance, Decreased endurance, Decreased balance, Decreased range of motion, Increased edema  Visit Diagnosis: Chronic pain of right knee  Other abnormalities of  gait and mobility     Problem List There are no problems to display for this patient.   Mathis Dad,  PT 01/31/2021, 1:42 PM  [facility] [address] [place], [place], [address] Phone: [phone]   Fax:  [phone]  Name: Taylor Boyer MRN: [id number] Date of Birth: Feb 01, 1956

## 2021-02-04 ENCOUNTER — Encounter: Payer: Self-pay | Admitting: Physical Therapy

## 2021-02-04 ENCOUNTER — Other Ambulatory Visit: Payer: Self-pay

## 2021-02-04 ENCOUNTER — Ambulatory Visit: Payer: Medicare Other | Admitting: Physical Therapy

## 2021-02-04 DIAGNOSIS — G8929 Other chronic pain: Secondary | ICD-10-CM

## 2021-02-04 DIAGNOSIS — M6281 Muscle weakness (generalized): Secondary | ICD-10-CM

## 2021-02-04 DIAGNOSIS — R600 Localized edema: Secondary | ICD-10-CM

## 2021-02-04 DIAGNOSIS — R2689 Other abnormalities of gait and mobility: Secondary | ICD-10-CM

## 2021-02-04 DIAGNOSIS — M25561 Pain in right knee: Secondary | ICD-10-CM | POA: Diagnosis not present

## 2021-02-04 NOTE — Therapy (Signed)
[place], [place], [address] Phone: [phone]   Fax:  [phone]  Physical Therapy Treatment Progress Note Reporting Period 12/19/2020 to 02/04/2021  See note below for Objective Data and Assessment of Progress/Goals.     Patient Details  Name: Taylor Boyer MRN: [id number] Date of Birth: 1955/04/23 Referring Provider (PT): Lesle Reek DO   Encounter Date: 02/04/2021   PT End of Session - 02/04/21 1410     Visit Number 11    Number of Visits 15    Date for PT Re-Evaluation 02/13/21    Authorization Type VA - FOTO 6th and 10 th vsit    Authorization - Visit Number 11    Authorization - Number of Visits 15    Progress Note Due on Visit 21    PT Start Time 4158    PT Stop Time 1500    PT Time Calculation (min) 45 min             Past Medical History:  Diagnosis Date   Allergy    Arthritis    knees   Cancer ([place])    basal cell skin cancer   History of adenomatous polyp of colon    Hyperlipidemia    Hypertension    Migraines     Past Surgical History:  Procedure Laterality Date   BUNIONECTOMY Right    CHOLECYSTECTOMY     COLONOSCOPY     KNEE ARTHROSCOPY WITH LATERAL RELEASE Right    POLYPECTOMY     R tka Right 12/14/2020   ROTATOR CUFF REPAIR Right    TONSILLECTOMY      There were no vitals filed for this visit.   Subjective Assessment - 02/04/21 1412     Subjective "I have been using that machine 3 x a day except for when I have PT. I can feel the difference with the machine."    Patient Stated Goals to get the knee better    Currently in Pain? Yes    Pain Score 4     Pain Location Knee    Pain Orientation Right    Pain Descriptors / Indicators Aching;Sore    Pain Onset More than a month ago    Pain Frequency Intermittent    Aggravating Factors  using the machine, stumbling    Pain Relieving Factors medication                [facility] PT Assessment -  02/04/21 0001       Assessment   Medical Diagnosis S/P R TKA    Referring Provider (PT) Lesle Reek DO                [facility] Adult PT Treatment/Exercise:  Therapeutic Exercise: Recumbent L1 x 3 min  Elliptical L1 x ramp L1 x 3 min  Hamstring 2 x 30 seconds standing Slant board calf stretch 2 x 30 sec Step down  using 6 in step RLE only with GTB in popliteal space and cues to hold knee extension x 2 sec  2 x 10 Standing body weight squat holding on to free motion 2 x 10  Manual Therapy: Skilled palpation and monitoring of pt during TPDN IASTM along the R hamstring Tibial ER with extension to promote terminal knee extension   Neuromuscular re-ed: N/A  Therapeutic Activity: N/A  Modalities: N/A  Self Care: N/A  Consider / progression for next session:  Trigger Point Dry Needling - 02/04/21 0001     Consent Given? Yes    Muscles Treated Lower Quadrant Hamstring    Hamstring Response Twitch response elicited;Palpable increased muscle length   R only                  PT Education - 02/04/21 1425     Education Details reviewed HEP. What TPDN is, benefits and what to expect.    Person(s) Educated Patient    Methods Explanation;Verbal cues;Handout    Comprehension Verbalized understanding;Verbal cues required              PT Short Term Goals - 01/29/21 1340       PT SHORT TERM GOAL #1   Title pt to be IND with initial HEP    Time 4    Period Weeks    Status Achieved    Target Date 01/16/21      PT SHORT TERM GOAL #2   Title increaes knee flexion to >/= 110 degrees, and extension to </= 10 degress for therapuetic progression    Baseline 11/15: 9-105    Time 4    Period Weeks    Status Partially Met    Target Date 01/16/21      PT SHORT TERM GOAL #3   Title pt to verbalize/ demo efficient gait with heel strike/ toe off  with LRAD for functional mobility    Baseline 11/15: MET    Time 4    Period Weeks     Status New    Target Date 01/16/21               PT Long Term Goals - 12/19/20 1636       PT LONG TERM GOAL #1   Title increase R knee active total arc ROM to >/= 5 to 120 with </= 2/10 max pain for funtional ROM required for ADLs    Time 8    Period Weeks    Status New    Target Date 02/13/21      PT LONG TERM GOAL #2   Title increase RLE gross strength to >/= 4+/5 to promote knee stability with walking/ standing    Time 8    Period Weeks    Status New    Target Date 02/13/21      PT LONG TERM GOAL #3   Title pt to be able to walk/ stand for >/= 60 min and navigate up/ down >/= 12 steps reciprocally with LRAD for functional mobility/ endurance required for in home and community amb    Time 8    Period Weeks    Status New    Target Date 02/13/21      PT LONG TERM GOAL #4   Title increase FOTO score to >/= 56% to demo improvement in function    Time 8    Period Weeks    Status New    Target Date 02/13/21      PT LONG TERM GOAL #5   Title pt to be IND with all HEP to maintain and progress her current LFO IND    Time 8    Period Weeks    Status New    Target Date 02/13/21                    Patient will benefit from skilled therapeutic intervention in order to improve the following deficits and impairments:     Visit Diagnosis:  Chronic pain of right knee  Other abnormalities of gait and mobility  Localized edema  Muscle weakness (generalized)     Problem List There are no problems to display for this patient.  [name] PT, DPT, LAT, ATC  02/04/21  3:27 PM      [facility] [address] [place], [place], [address] Phone: [phone]   Fax:  [phone]  Name: Taylor Boyer MRN: [id number] Date of Birth: Dec 18, 1955

## 2021-02-06 ENCOUNTER — Encounter: Payer: Self-pay | Admitting: Physical Therapy

## 2021-02-06 ENCOUNTER — Ambulatory Visit: Payer: Medicare Other | Admitting: Physical Therapy

## 2021-02-06 ENCOUNTER — Other Ambulatory Visit: Payer: Self-pay

## 2021-02-06 DIAGNOSIS — R600 Localized edema: Secondary | ICD-10-CM

## 2021-02-06 DIAGNOSIS — R2689 Other abnormalities of gait and mobility: Secondary | ICD-10-CM

## 2021-02-06 DIAGNOSIS — G8929 Other chronic pain: Secondary | ICD-10-CM

## 2021-02-06 DIAGNOSIS — M25561 Pain in right knee: Secondary | ICD-10-CM

## 2021-02-06 DIAGNOSIS — M6281 Muscle weakness (generalized): Secondary | ICD-10-CM

## 2021-02-06 NOTE — Therapy (Signed)
[place], [place], [address] Phone: [phone]   Fax:  [phone]  Physical Therapy Treatment  Patient Details  Name: Taylor Boyer MRN: [id number] Date of Birth: October 20, 1955 Referring Provider (PT): Lesle Reek DO   Encounter Date: 02/06/2021   PT End of Session - 02/06/21 1302     Visit Number 12    Number of Visits 15    Date for PT Re-Evaluation 02/13/21    Authorization Type [facility] - FOTO 6th and 10 th vsit    Authorization - Visit Number 12    Authorization - Number of Visits 15    Progress Note Due on Visit 21    PT Start Time 1301    PT Stop Time 1341    PT Time Calculation (min) 40 min             Past Medical History:  Diagnosis Date   Allergy    Arthritis    knees   Cancer ([place])    basal cell skin cancer   History of adenomatous polyp of colon    Hyperlipidemia    Hypertension    Migraines     Past Surgical History:  Procedure Laterality Date   BUNIONECTOMY Right    CHOLECYSTECTOMY     COLONOSCOPY     KNEE ARTHROSCOPY WITH LATERAL RELEASE Right    POLYPECTOMY     R tka Right 12/14/2020   ROTATOR CUFF REPAIR Right    TONSILLECTOMY      There were no vitals filed for this visit.   Subjective Assessment - 02/06/21 1305     Subjective Pt reports she has been using her knee ext machine and she feels this is helping.  She feels that TDN was beneficial.  She took 7k steps yesterday at the [facility] and is sore today.  6/10 R knee pain currently.    Patient Stated Goals to get the knee better    Pain Onset More than a month ago            Therapeutic Exercise:   - recumbent bike 5 min for warm up while taking subjective - 4'' step up with pull up band for ext resistance - 3x10 - Heel raise and lower on the step - 3x10 - HS curl - 2x10 _0 # - 2 down 1 up - RDL - with TRX uni support - 2x10 - wall squat - 60''x2   Neuromuscular re-ed, to improve balance and  reduce fall risk: - SLS on foam - 30'' x4   Trigger Point Dry Needling Treatment: Pre-treatment instruction: Patient instructed on dry needling rationale, procedures, and possible side effects including pain during treatment (achy,cramping feeling), bruising, drop of blood, lightheadedness, nausea, sweating. Patient Consent Given: Yes Education handout provided: No Muscles treated: R HS (biceps) and R gastroc (lateral) Needle size and number: .30x75m x 2 Electrical stimulation performed: No Parameters: N/A Treatment response/outcome: Twitch response elicited Post-treatment instructions: Patient instructed to expect possible mild to moderate muscle soreness later today and/or tomorrow. Patient instructed in methods to reduce muscle soreness and to continue prescribed HEP. If patient was dry needled over the lung field, patient was instructed on signs and symptoms of pneumothorax and, however unlikely, to see immediate medical attention should they occur. Patient was also educated on signs and symptoms of infection and to seek medical attention should they occur. Patient verbalized understanding of these instructions and education.      PT Short Term  Goals - 01/29/21 1340       PT SHORT TERM GOAL #1   Title pt to be IND with initial HEP    Time 4    Period Weeks    Status Achieved    Target Date 01/16/21      PT SHORT TERM GOAL #2   Title increaes knee flexion to >/= 110 degrees, and extension to </= 10 degress for therapuetic progression    Baseline 11/15: 9-105    Time 4    Period Weeks    Status Partially Met    Target Date 01/16/21      PT SHORT TERM GOAL #3   Title pt to verbalize/ demo efficient gait with heel strike/ toe off  with LRAD for functional mobility    Baseline 11/15: MET    Time 4    Period Weeks    Status New    Target Date 01/16/21               PT Long Term Goals - 12/19/20 1636       PT LONG TERM GOAL #1   Title increase R knee active total  arc ROM to >/= 5 to 120 with </= 2/10 max pain for funtional ROM required for ADLs    Time 8    Period Weeks    Status New    Target Date 02/13/21      PT LONG TERM GOAL #2   Title increase RLE gross strength to >/= 4+/5 to promote knee stability with walking/ standing    Time 8    Period Weeks    Status New    Target Date 02/13/21      PT LONG TERM GOAL #3   Title pt to be able to walk/ stand for >/= 60 min and navigate up/ down >/= 12 steps reciprocally with LRAD for functional mobility/ endurance required for in home and community amb    Time 8    Period Weeks    Status New    Target Date 02/13/21      PT LONG TERM GOAL #4   Title increase FOTO score to >/= 56% to demo improvement in function    Time 8    Period Weeks    Status New    Target Date 02/13/21      PT LONG TERM GOAL #5   Title pt to be IND with all HEP to maintain and progress her current LFO IND    Time 8    Period Weeks    Status New    Target Date 02/13/21                   Plan - 02/06/21 1343     Clinical Impression Statement Overall, Taylor Boyer is progressing fair with therapy.  Pt reports no increase in baseline pain following therapy.  Today we concentrated on knee strengthening.  Pt continues to show R knee ext deficit.  She reports subjective feeling of her knee being "looser" after TDN, but shows minimal improvement in knee ext ROM.  At this point she is functioning well and was able to walk all day at the [facility] with some soreness and swelling afterwards.  Continued eccentric HS work in attempt to improve muscle length.  Pt will continue to benefit from skilled physical therapy to address remaining deficits and achieve listed goals.  Continue per POC.    PT Treatment/Interventions ADLs/Self Care Home Management;Cryotherapy;Electrical Stimulation;Iontophoresis 26m/ml Dexamethasone;Moist Heat;Ultrasound;Gait  training;Stair training;Functional mobility training;Therapeutic activities;Therapeutic  exercise;Balance training;Neuromuscular re-education;Patient/family education;Manual techniques;Taping;Dry needling;Passive range of motion;Vasopneumatic Device    PT Next Visit Plan review/ update HEP PRN, Response to DN,  knee ROM/ mobs, gross hip /knee strengthening ,gait training, vaso for swelling PRN    PT Home Exercise Plan [id number] - quad set, heel slides with strap, SLR, sidelying hip abduction,    Consulted and Agree with Plan of Care Patient             Patient will benefit from skilled therapeutic intervention in order to improve the following deficits and impairments:  Pain, Improper body mechanics, Abnormal gait, Increased muscle spasms, Postural dysfunction, Decreased activity tolerance, Decreased endurance, Decreased balance, Decreased range of motion, Increased edema  Visit Diagnosis: Chronic pain of right knee  Other abnormalities of gait and mobility  Localized edema  Muscle weakness (generalized)     Problem List There are no problems to display for this patient.   Mathis Dad, PT 02/06/2021, 1:47 PM  [facility] [address] [place], [place], [address] Phone: [phone]   Fax:  [phone]  Name: Taylor Boyer MRN: [id number] Date of Birth: [birth year]

## 2021-02-11 ENCOUNTER — Ambulatory Visit: Payer: Medicare Other | Admitting: Physical Therapy

## 2021-02-13 ENCOUNTER — Encounter: Payer: Medicare Other | Admitting: Physical Therapy

## 2021-02-15 ENCOUNTER — Encounter: Payer: Medicare Other | Admitting: Physical Therapy

## 2021-02-20 ENCOUNTER — Ambulatory Visit: Payer: Medicare Other | Attending: Surgery | Admitting: Physical Therapy

## 2021-02-20 ENCOUNTER — Other Ambulatory Visit: Payer: Self-pay

## 2021-02-20 ENCOUNTER — Encounter: Payer: Medicare Other | Admitting: Physical Therapy

## 2021-02-20 ENCOUNTER — Encounter: Payer: Self-pay | Admitting: Physical Therapy

## 2021-02-20 DIAGNOSIS — R2689 Other abnormalities of gait and mobility: Secondary | ICD-10-CM | POA: Diagnosis present

## 2021-02-20 DIAGNOSIS — G8929 Other chronic pain: Secondary | ICD-10-CM | POA: Diagnosis present

## 2021-02-20 DIAGNOSIS — R600 Localized edema: Secondary | ICD-10-CM | POA: Diagnosis present

## 2021-02-20 DIAGNOSIS — M25561 Pain in right knee: Secondary | ICD-10-CM | POA: Diagnosis not present

## 2021-02-20 DIAGNOSIS — M6281 Muscle weakness (generalized): Secondary | ICD-10-CM | POA: Diagnosis present

## 2021-02-20 NOTE — Therapy (Signed)
[place], [place], [address] Phone: [phone]   Fax:  [phone]  Physical Therapy Treatment  Patient Details  Name: Taylor Boyer MRN: [id number] Date of Birth: 1955/07/07 Referring Provider (PT): Lesle Reek DO   Encounter Date: 02/20/2021   PT End of Session - 02/20/21 1529     Visit Number 13    Number of Visits 15    Date for PT Re-Evaluation 03/30/21   extending   Authorization Type VA - FOTO 6th and 10 th vsit    Authorization - Visit Number 13    Authorization - Number of Visits 15    Progress Note Due on Visit 21    PT Start Time 3893    PT Stop Time 1614    PT Time Calculation (min) 44 min             Past Medical History:  Diagnosis Date   Allergy    Arthritis    knees   Cancer ([place])    basal cell skin cancer   History of adenomatous polyp of colon    Hyperlipidemia    Hypertension    Migraines     Past Surgical History:  Procedure Laterality Date   BUNIONECTOMY Right    CHOLECYSTECTOMY     COLONOSCOPY     KNEE ARTHROSCOPY WITH LATERAL RELEASE Right    POLYPECTOMY     R tka Right 12/14/2020   ROTATOR CUFF REPAIR Right    TONSILLECTOMY      There were no vitals filed for this visit.   Subjective Assessment - 02/20/21 1533     Subjective Pt reports that she has been sick, but is starting to feel better now.  She has been working on straightening the knee at home.  She is having minimal pain at 0/10 today.    Patient Stated Goals to get the knee better    Pain Onset More than a month ago            Objective:   R knee ROM: 8-95  Therapeutic Exercise:   - recumbent bike 5 min for warm up while taking subjective - 4'' step down - 3x10  - Heel raise and S/L lower on the step - 3x8 - HS curl - 2x10 @15 # - 2 down 1 up - RDL - with TRX uni support - 2x10 - S/L wall squat - 2x10 - walking march  - 3 laps   Neuromuscular re-ed, to improve balance  and reduce fall risk: - SLS on foam - 45'' x4  Gait training: - Concentrating on heel toe pattern     PT Short Term Goals - 01/29/21 1340       PT SHORT TERM GOAL #1   Title pt to be IND with initial HEP    Time 4    Period Weeks    Status Achieved    Target Date 01/16/21      PT SHORT TERM GOAL #2   Title increaes knee flexion to >/= 110 degrees, and extension to </= 10 degress for therapuetic progression    Baseline 11/15: 9-105    Time 4    Period Weeks    Status Partially Met    Target Date 01/16/21      PT SHORT TERM GOAL #3   Title pt to verbalize/ demo efficient gait with heel strike/ toe off  with LRAD for functional mobility    Baseline 11/15: MET  Time 4    Period Weeks    Status New    Target Date 01/16/21               PT Long Term Goals - 02/20/21 1601       PT LONG TERM GOAL #1   Title increase R knee active total arc ROM to >/= 5 to 120 with </= 2/10 max pain for funtional ROM required for ADLs    Time 8    Period Weeks    Status On-going    Target Date 03/30/20      PT LONG TERM GOAL #2   Title increase RLE gross strength to >/= 4+/5 to promote knee stability with walking/ standing    Time 8    Period Weeks    Status On-going    Target Date 03/30/20      PT LONG TERM GOAL #3   Title pt to be able to walk/ stand for >/= 60 min and navigate up/ down >/= 12 steps reciprocally with LRAD for functional mobility/ endurance required for in home and community amb    Baseline MET    Time 8    Period Weeks    Status Achieved    Target Date 02/13/21      PT LONG TERM GOAL #4   Title increase FOTO score to >/= 56% to demo improvement in function    Time 8    Period Weeks    Status Achieved    Target Date 02/13/21      PT LONG TERM GOAL #5   Title pt to be IND with all HEP to maintain and progress her current LFO IND    Time 8    Period Weeks    Status Achieved    Target Date 02/13/21                   Plan - 02/20/21  1554     Clinical Impression Statement Overall, Taylor Boyer is progressing well with therapy.  Pt reports a mild increase in pain following therapy.  Today we concentrated on knee strengthening, hip strengthening, and knee range of motion.  Pt with some residual stiffness from extended period missing PT d/t illness.  We will continue to progress strength and ROM as able.  Pt will continue to benefit from skilled physical therapy to address remaining deficits and achieve listed goals.  Extending POC and 2 goals to account for missed visits.    Stability/Clinical Decision Making Stable/Uncomplicated    Clinical Decision Making Low    Rehab Potential Good    PT Frequency 2x / week    PT Duration 6 weeks    PT Treatment/Interventions ADLs/Self Care Home Management;Cryotherapy;Electrical Stimulation;Iontophoresis 28m/ml Dexamethasone;Moist Heat;Ultrasound;Gait training;Stair training;Functional mobility training;Therapeutic activities;Therapeutic exercise;Balance training;Neuromuscular re-education;Patient/family education;Manual techniques;Taping;Dry needling;Passive range of motion;Vasopneumatic Device    PT Next Visit Plan review/ update HEP PRN, Response to DN,  knee ROM/ mobs, gross hip /knee strengthening ,gait training, vaso for swelling PRN    PT Home Exercise Plan [id number]- quad set, heel slides with strap, SLR, sidelying hip abduction,    Consulted and Agree with Plan of Care Patient             Patient will benefit from skilled therapeutic intervention in order to improve the following deficits and impairments:  Pain, Improper body mechanics, Abnormal gait, Increased muscle spasms, Postural dysfunction, Decreased activity tolerance, Decreased endurance, Decreased balance, Decreased range of motion, Increased edema  Visit  Diagnosis: Chronic pain of right knee - Plan: PT plan of care cert/re-cert  Other abnormalities of gait and mobility - Plan: PT plan of care cert/re-cert  Localized  edema - Plan: PT plan of care cert/re-cert  Muscle weakness (generalized) - Plan: PT plan of care cert/re-cert     Problem List There are no problems to display for this patient.   Mathis Dad, PT 02/20/2021, 4:08 PM  [facility] [address] [place], [place], [address] Phone: [phone]   Fax:  [phone]  Name: Taylor Boyer MRN: [id number] Date of Birth: 1956-01-30

## 2021-02-27 ENCOUNTER — Ambulatory Visit: Payer: Medicare Other | Admitting: Physical Therapy

## 2021-02-27 ENCOUNTER — Other Ambulatory Visit: Payer: Self-pay

## 2021-02-27 ENCOUNTER — Encounter: Payer: Self-pay | Admitting: Physical Therapy

## 2021-02-27 DIAGNOSIS — M25561 Pain in right knee: Secondary | ICD-10-CM | POA: Diagnosis not present

## 2021-02-27 DIAGNOSIS — R600 Localized edema: Secondary | ICD-10-CM

## 2021-02-27 DIAGNOSIS — R2689 Other abnormalities of gait and mobility: Secondary | ICD-10-CM

## 2021-02-27 DIAGNOSIS — G8929 Other chronic pain: Secondary | ICD-10-CM

## 2021-02-27 NOTE — Therapy (Signed)
[place], [place], [address] Phone: [phone]   Fax:  [phone]  PHYSICAL THERAPY DISCHARGE SUMMARY  Visits from Start of Care: 14  Current functional level related to goals / functional outcomes: See assessment/goals   Remaining deficits: See assessment/goals   Education / Equipment: HEP and D/C plans  Patient agrees to discharge. Patient goals were partially met. Patient is being discharged due to being pleased with the current functional level.   Patient Details  Name: Taylor Boyer MRN: [id number] Date of Birth: [birth year] Referring Provider (PT): Lesle Reek DO   Encounter Date: 02/27/2021   PT End of Session - 02/27/21 1213     Visit Number 14    Number of Visits 15    Date for PT Re-Evaluation 03/30/21   extending   Authorization Type VA - FOTO 6th and 10 th vsit    Authorization - Visit Number 14    Authorization - Number of Visits 15    Progress Note Due on Visit 21    PT Start Time 1215    PT Stop Time 1258    PT Time Calculation (min) 43 min             Past Medical History:  Diagnosis Date   Allergy    Arthritis    knees   Cancer ([place])    basal cell skin cancer   History of adenomatous polyp of colon    Hyperlipidemia    Hypertension    Migraines     Past Surgical History:  Procedure Laterality Date   BUNIONECTOMY Right    CHOLECYSTECTOMY     COLONOSCOPY     KNEE ARTHROSCOPY WITH LATERAL RELEASE Right    POLYPECTOMY     R tka Right 12/14/2020   ROTATOR CUFF REPAIR Right    TONSILLECTOMY      There were no vitals filed for this visit.   Subjective Assessment - 02/27/21 1219     Subjective Pt report that she continues to have tightness and swelling in the knee.  She is HEP compliant.  She had pain in the bottom of her foot after doing heel raises on the step, but this is now resolved.  She is having minimal pain at 0/10 today.    Patient Stated  Goals to get the knee better    Pain Onset More than a month ago            Objective:  ROM R knee 7-95   LE MMT:  MMT Right 02/27/2021 Left 02/27/2021  Hip flexion    Knee extension 5   Knee flexion 4+   Hip abduction 4+   Hip extension 4+   Hip external rotation    Hip internal rotation    Hip adduction    Ankle dorsiflexion    Ankle plantarflexion    Ankle inversion    Ankle eversion     (Blank rows = not tested; all scores listed out of a possible 5)     Therapeutic Exercise:   - recumbent bike 5 min for warm up while taking subjective - 4'' step down - 3x10  - Uni heel raise - TRX - 3x10 - HS curl - 2x10 @15 # - 2 down 1 up - uni knee ext machine - 3x10 @15 # - RDL - with TRX uni support - 3x10 - leg press - 3x10 @ 45# - S/L wall squat - 3x10 - walking march  -  3 laps   Neuromuscular re-ed, to improve balance and reduce fall risk: - SLS on foam - 45'' x4  Therapeutic Activity - collecting information for goals, checking progress, and reviewing with patient      PT Short Term Goals - 02/27/21 1301       PT SHORT TERM GOAL #1   Title pt to be IND with initial HEP    Time 4    Period Weeks    Status Achieved    Target Date 01/16/21      PT SHORT TERM GOAL #2   Title increaes knee flexion to >/= 110 degrees, and extension to </= 10 degress for therapuetic progression    Baseline 11/15: 9-105    Time 4    Period Weeks    Status Partially Met    Target Date 01/16/21      PT SHORT TERM GOAL #3   Title pt to verbalize/ demo efficient gait with heel strike/ toe off  with LRAD for functional mobility    Baseline 11/15: MET    Time 4    Period Weeks    Status Achieved    Target Date 01/16/21               PT Long Term Goals - 02/27/21 1225       PT LONG TERM GOAL #1   Title increase R knee active total arc ROM to >/= 5 to 120 with </= 2/10 max pain for funtional ROM required for ADLs    Time 8    Period Weeks    Status Partially  Met    Target Date 03/30/20      PT LONG TERM GOAL #2   Title increase RLE gross strength to >/= 4+/5 to promote knee stability with walking/ standing    Time 8    Period Weeks    Status Achieved    Target Date 03/30/20      PT LONG TERM GOAL #3   Title pt to be able to walk/ stand for >/= 60 min and navigate up/ down >/= 12 steps reciprocally with LRAD for functional mobility/ endurance required for in home and community amb    Baseline MET    Time 8    Period Weeks    Status Achieved    Target Date 02/13/21      PT LONG TERM GOAL #4   Title increase FOTO score to >/= 56% to demo improvement in function    Time 8    Period Weeks    Status Achieved    Target Date 02/13/21      PT LONG TERM GOAL #5   Title pt to be IND with all HEP to maintain and progress her current LFO IND    Time 8    Period Weeks    Status Achieved    Target Date 02/13/21                   Plan - 02/27/21 1242     Clinical Impression Statement Taylor Boyer has progressed well with therapy.  Improved impairments include: knee and hip strength, knee ROM.  Functional improvements include: transfers, squatting, lifting, ambulation, step navigation.  Progressions needed include: continued work at home via [employer].  Barriers to progress include: difficulty with ROM progress.  Pt has progressed well with strength and balance, but knee ROM progression has been difficult.  She is using an  ext board at home.  At this point she can continue this on her own as all other metrics are looking very good; she agrees to plan.  Please see baseline and/or status section in "Goals" for specific progress on short term and long term goals established at evaluation.  I recommend D/C home with HEP; pt agrees with plan.    Stability/Clinical Decision Making Stable/Uncomplicated    Rehab Potential Good    PT Frequency 2x / week    PT Duration 6 weeks    PT Treatment/Interventions ADLs/Self Care Home  Management;Cryotherapy;Electrical Stimulation;Iontophoresis 88m/ml Dexamethasone;Moist Heat;Ultrasound;Gait training;Stair training;Functional mobility training;Therapeutic activities;Therapeutic exercise;Balance training;Neuromuscular re-education;Patient/family education;Manual techniques;Taping;Dry needling;Passive range of motion;Vasopneumatic Device    PT Next Visit Plan review/ update HEP PRN, Response to DN,  knee ROM/ mobs, gross hip /knee strengthening ,gait training, vaso for swelling PRN    PT Home Exercise Plan [id number]- quad set, heel slides with strap, SLR, sidelying hip abduction,    Consulted and Agree with Plan of Care Patient             Patient will benefit from skilled therapeutic intervention in order to improve the following deficits and impairments:  Pain, Improper body mechanics, Abnormal gait, Increased muscle spasms, Postural dysfunction, Decreased activity tolerance, Decreased endurance, Decreased balance, Decreased range of motion, Increased edema  Visit Diagnosis: Chronic pain of right knee  Other abnormalities of gait and mobility  Localized edema     Problem List There are no problems to display for this patient.   KMathis Dad PT 02/27/2021, 1:01 PM  [facility] [address]Phone: [phone]  Fax:  [phone] Name: MVinita PrentissMRN: [id number] of Birth: 801-08-1955

## 2021-04-17 ENCOUNTER — Other Ambulatory Visit: Payer: Self-pay | Admitting: Family Medicine

## 2021-04-17 DIAGNOSIS — Z1231 Encounter for screening mammogram for malignant neoplasm of breast: Secondary | ICD-10-CM

## 2021-06-03 ENCOUNTER — Other Ambulatory Visit: Payer: Self-pay

## 2021-06-03 ENCOUNTER — Ambulatory Visit
Admission: RE | Admit: 2021-06-03 | Discharge: 2021-06-03 | Disposition: A | Discharge status: Home / Self Care | Payer: Medicare Other | Source: Ambulatory Visit | Attending: Family Medicine | Admitting: Family Medicine

## 2021-06-03 DIAGNOSIS — Z1231 Encounter for screening mammogram for malignant neoplasm of breast: Secondary | ICD-10-CM

## 2021-06-04 ENCOUNTER — Other Ambulatory Visit: Payer: Self-pay | Admitting: Family Medicine

## 2021-06-04 DIAGNOSIS — R928 Other abnormal and inconclusive findings on diagnostic imaging of breast: Secondary | ICD-10-CM

## 2021-06-24 ENCOUNTER — Other Ambulatory Visit: Payer: Medicare Other

## 2021-07-04 ENCOUNTER — Ambulatory Visit
Admission: RE | Admit: 2021-07-04 | Discharge: 2021-07-04 | Disposition: A | Discharge status: Home / Self Care | Payer: Medicare Other | Source: Ambulatory Visit | Attending: Family Medicine | Admitting: Family Medicine

## 2021-07-04 ENCOUNTER — Other Ambulatory Visit: Payer: Self-pay | Admitting: Family Medicine

## 2021-07-04 DIAGNOSIS — R928 Other abnormal and inconclusive findings on diagnostic imaging of breast: Secondary | ICD-10-CM

## 2021-07-04 DIAGNOSIS — N6310 Unspecified lump in the right breast, unspecified quadrant: Secondary | ICD-10-CM

## 2021-07-12 ENCOUNTER — Ambulatory Visit
Admission: RE | Admit: 2021-07-12 | Discharge: 2021-07-12 | Disposition: A | Discharge status: Home / Self Care | Payer: Medicare Other | Source: Ambulatory Visit | Attending: Family Medicine | Admitting: Family Medicine

## 2021-07-12 DIAGNOSIS — N6310 Unspecified lump in the right breast, unspecified quadrant: Secondary | ICD-10-CM

## 2021-07-12 HISTORY — PX: BREAST BIOPSY: SHX20

## 2021-07-30 ENCOUNTER — Other Ambulatory Visit: Payer: Self-pay | Admitting: Surgery

## 2021-07-30 DIAGNOSIS — D241 Benign neoplasm of right breast: Secondary | ICD-10-CM

## 2021-08-01 ENCOUNTER — Other Ambulatory Visit: Payer: Self-pay | Admitting: Surgery

## 2021-08-01 DIAGNOSIS — D241 Benign neoplasm of right breast: Secondary | ICD-10-CM

## 2021-08-22 ENCOUNTER — Encounter (HOSPITAL_BASED_OUTPATIENT_CLINIC_OR_DEPARTMENT_OTHER): Payer: Self-pay | Admitting: Surgery

## 2021-08-22 ENCOUNTER — Other Ambulatory Visit: Payer: Self-pay

## 2021-08-28 ENCOUNTER — Encounter (HOSPITAL_BASED_OUTPATIENT_CLINIC_OR_DEPARTMENT_OTHER)
Admission: RE | Admit: 2021-08-28 | Discharge: 2021-08-28 | Disposition: A | Discharge status: Home / Self Care | Payer: Medicare Other | Source: Ambulatory Visit | Attending: Surgery | Admitting: Surgery

## 2021-08-28 DIAGNOSIS — Z01810 Encounter for preprocedural cardiovascular examination: Secondary | ICD-10-CM | POA: Diagnosis present

## 2021-08-28 NOTE — Progress Notes (Signed)
       Patient Instructions  The night before surgery:  No food after midnight. ONLY clear liquids after midnight  The day of surgery (if you do NOT have diabetes):  Drink ONE (1) Pre-Surgery Clear Ensure as directed.   This drink was given to you during your hospital  pre-op appointment visit. The pre-op nurse will instruct you on the time to drink the  Pre-Surgery Ensure depending on your surgery time. Finish the drink at the designated time by the pre-op nurse.  Nothing else to drink after completing the  Pre-Surgery Clear Ensure.  The day of surgery (if you have diabetes): Drink ONE (1) Gatorade 2 (G2) as directed. This drink was given to you during your hospital  pre-op appointment visit.  The pre-op nurse will instruct you on the time to drink the   Gatorade 2 (G2) depending on your surgery time. Color of the Gatorade may vary. Red is not allowed. Nothing else to drink after completing the  Gatorade 2 (G2).         If you have questions, please contact your surgeon's office.Patient was provided with CHG cleanser to use at home before the procedure. Patient verbalized understanding of instructions. 

## 2021-08-30 ENCOUNTER — Ambulatory Visit
Admission: RE | Admit: 2021-08-30 | Discharge: 2021-08-30 | Disposition: A | Discharge status: Home / Self Care | Payer: Medicare Other | Source: Ambulatory Visit | Attending: Surgery | Admitting: Surgery

## 2021-08-30 DIAGNOSIS — D241 Benign neoplasm of right breast: Secondary | ICD-10-CM

## 2021-08-31 NOTE — H&P (Signed)
REFERRING PHYSICIAN: Fanny Bien, MD  PROVIDER: Beverlee Nims, MD  MRN: [id number] DOB: 06-Dec-1955  Subjective   Chief Complaint: New Consultation (Breast )   History of Present Illness: Taylor Boyer is a 66 y.o. female who is seen as an office consultation for evaluation of New Consultation (Breast ) .   This is a 66 year old female referred by the breast center after the recent diagnosis of a right breast intraductal papilloma. She had gone for screening mammography's when an abnormality was seen in both her breast. She underwent diagnostic imaging and ultrasound showing a small intraductal mass with a dilated duct in the right breast and a cyst in the left breast. The mass in the right breast was biopsied and showed an intraductal papilloma with no evidence of atypia. It measured 6 mm. She has had no previous problems regarding her breast. There are multiple family members on both sides of her family with cancers including breast cancer. She has no cardiopulmonary issues. She denies nipple discharge  Review of Systems: A complete review of systems was obtained from the patient. I have reviewed this information and discussed as appropriate with the patient. See HPI as well for other ROS.  ROS   Medical History: Past Medical History:  Diagnosis Date   Arthritis   History of cancer   Hypertension   There is no problem list on file for this patient.  Past Surgical History:  Procedure Laterality Date   right knee replacement Right 2022   right shoulder surgery Right    No Known Allergies  Current Outpatient Medications on File Prior to Visit  Medication Sig Dispense Refill   cholecalciferol (VITAMIN D3) 1000 unit tablet Take 1 tablet by mouth once daily   fexofenadine (ALLEGRA) 180 MG tablet Take 1 tablet by mouth once daily   lisinopriL (ZESTRIL) 20 MG tablet   montelukast (SINGULAIR) 10 mg tablet   prasterone, dhea, (INTRAROSA) 6.5 mg Inst    rosuvastatin (CRESTOR) 10 MG tablet Take 0.5 tablets by mouth at bedtime   valACYclovir (VALTREX) 500 MG tablet   No current facility-administered medications on file prior to visit.   History reviewed. No pertinent family history.   Social History   Tobacco Use  Smoking Status Former   Types: Cigarettes  Smokeless Tobacco Not on file    Social History   Socioeconomic History   Marital status: Unknown  Tobacco Use   Smoking status: Former  Types: Cigarettes  Scientific laboratory technician Use: Never used  Substance and Sexual Activity   Alcohol use: Yes   Drug use: Never   Objective:   Vitals:   BP: 120/70  Pulse: 77  Temp: 36.2 C (97.2 F)  SpO2: 97%  Weight: 73.3 kg (161 lb 9.6 oz)  Height: 167.6 cm ('5\' 6"'$ )   Body mass index is 26.08 kg/m.  Physical Exam   She appears well on exam  There is minimal ecchymosis of the right breast from her biopsy. I cannot palpate a mass in the breast. The nipple areolar complex appears normal.  There is no axillary adenopathy  Labs, Imaging and Diagnostic Testing: I have reviewed her pathology results and have reviewed her mammograms and ultrasound  Assessment and Plan:   Diagnoses and all orders for this visit:  Intraductal papilloma of breast, right    I gave the patient a copy of her pathology results and we discussed the diagnosis of an intraductal papilloma. We discussed the reasoning to proceed  with an excisional biopsy of the mass including to rule out a developing malignancy. This would be with a radioactive seed guided right breast lumpectomy. I explained the surgical procedure in detail. We discussed the risks which includes but is not limited to bleeding, infection, the need for further surgery if malignancy is found, cardiopulmonary issues, postoperative recovery, etc. She understands and wishes to proceed with surgery which will be scheduled

## 2021-09-02 ENCOUNTER — Encounter (HOSPITAL_BASED_OUTPATIENT_CLINIC_OR_DEPARTMENT_OTHER): Payer: Self-pay | Admitting: Surgery

## 2021-09-02 ENCOUNTER — Other Ambulatory Visit: Payer: Self-pay

## 2021-09-02 ENCOUNTER — Ambulatory Visit
Admission: RE | Admit: 2021-09-02 | Discharge: 2021-09-02 | Disposition: A | Discharge status: Home / Self Care | Payer: Medicare Other | Source: Ambulatory Visit | Attending: Surgery | Admitting: Surgery

## 2021-09-02 ENCOUNTER — Ambulatory Visit (HOSPITAL_BASED_OUTPATIENT_CLINIC_OR_DEPARTMENT_OTHER)
Admission: RE | Admit: 2021-09-02 | Discharge: 2021-09-02 | Disposition: A | Discharge status: Home / Self Care | Payer: Medicare Other | Attending: Surgery | Admitting: Surgery

## 2021-09-02 ENCOUNTER — Encounter (HOSPITAL_BASED_OUTPATIENT_CLINIC_OR_DEPARTMENT_OTHER)
Admission: RE | Disposition: A | Discharge status: Home / Self Care | Payer: Self-pay | Source: Home / Self Care | Attending: Surgery

## 2021-09-02 ENCOUNTER — Ambulatory Visit (HOSPITAL_BASED_OUTPATIENT_CLINIC_OR_DEPARTMENT_OTHER): Payer: Medicare Other | Admitting: Anesthesiology

## 2021-09-02 DIAGNOSIS — D241 Benign neoplasm of right breast: Secondary | ICD-10-CM | POA: Insufficient documentation

## 2021-09-02 DIAGNOSIS — I10 Essential (primary) hypertension: Secondary | ICD-10-CM | POA: Diagnosis not present

## 2021-09-02 DIAGNOSIS — E785 Hyperlipidemia, unspecified: Secondary | ICD-10-CM | POA: Insufficient documentation

## 2021-09-02 DIAGNOSIS — Z87891 Personal history of nicotine dependence: Secondary | ICD-10-CM | POA: Insufficient documentation

## 2021-09-02 DIAGNOSIS — Z01818 Encounter for other preprocedural examination: Secondary | ICD-10-CM

## 2021-09-02 HISTORY — PX: BREAST EXCISIONAL BIOPSY: SUR124

## 2021-09-02 HISTORY — DX: Other specified postprocedural states: Z98.890

## 2021-09-02 HISTORY — PX: BREAST LUMPECTOMY WITH RADIOACTIVE SEED LOCALIZATION: SHX6424

## 2021-09-02 SURGERY — BREAST LUMPECTOMY WITH RADIOACTIVE SEED LOCALIZATION
Anesthesia: General | Site: Breast | Laterality: Right

## 2021-09-02 MED ORDER — CEFAZOLIN SODIUM-DEXTROSE 2-4 GM/100ML-% IV SOLN
2.0000 g | INTRAVENOUS | Status: AC
  Administered 2021-09-02: 2 g via INTRAVENOUS

## 2021-09-02 MED ORDER — DEXAMETHASONE SODIUM PHOSPHATE 10 MG/ML IJ SOLN
INTRAMUSCULAR | Status: AC
  Filled 2021-09-02: qty 1

## 2021-09-02 MED ORDER — FENTANYL CITRATE (PF) 100 MCG/2ML IJ SOLN: 25.0000 ug | INTRAMUSCULAR | Status: DC | PRN

## 2021-09-02 MED ORDER — FENTANYL CITRATE (PF) 100 MCG/2ML IJ SOLN
INTRAMUSCULAR | Status: DC | PRN
  Administered 2021-09-02 (×2): 50 ug via INTRAVENOUS

## 2021-09-02 MED ORDER — AMISULPRIDE (ANTIEMETIC) 5 MG/2ML IV SOLN: 10.0000 mg | Freq: Once | INTRAVENOUS | Status: DC | PRN

## 2021-09-02 MED ORDER — SCOPOLAMINE 1 MG/3DAYS TD PT72
MEDICATED_PATCH | TRANSDERMAL | Status: AC
  Filled 2021-09-02: qty 1

## 2021-09-02 MED ORDER — FENTANYL CITRATE (PF) 100 MCG/2ML IJ SOLN
INTRAMUSCULAR | Status: AC
  Filled 2021-09-02: qty 2

## 2021-09-02 MED ORDER — OXYCODONE HCL 5 MG/5ML PO SOLN: 5.0000 mg | Freq: Once | ORAL | Status: DC | PRN

## 2021-09-02 MED ORDER — ONDANSETRON HCL 4 MG/2ML IJ SOLN
4.0000 mg | Freq: Once | INTRAMUSCULAR | Status: DC | PRN
Start: 2021-09-02 — End: 2021-09-02

## 2021-09-02 MED ORDER — CHLORHEXIDINE GLUCONATE CLOTH 2 % EX PADS: 6.0000 | MEDICATED_PAD | Freq: Once | CUTANEOUS | Status: DC

## 2021-09-02 MED ORDER — TRAMADOL HCL 50 MG PO TABS: 50.0000 mg | ORAL_TABLET | Freq: Four times a day (QID) | ORAL | 0 refills | Status: AC | PRN

## 2021-09-02 MED ORDER — ONDANSETRON HCL 4 MG/2ML IJ SOLN
INTRAMUSCULAR | Status: DC | PRN
  Administered 2021-09-02: 4 mg via INTRAVENOUS

## 2021-09-02 MED ORDER — DEXAMETHASONE SODIUM PHOSPHATE 4 MG/ML IJ SOLN
INTRAMUSCULAR | Status: DC | PRN
  Administered 2021-09-02: 5 mg via INTRAVENOUS

## 2021-09-02 MED ORDER — LIDOCAINE 2% (20 MG/ML) 5 ML SYRINGE
INTRAMUSCULAR | Status: AC
  Filled 2021-09-02: qty 5

## 2021-09-02 MED ORDER — PHENYLEPHRINE HCL (PRESSORS) 10 MG/ML IV SOLN
INTRAVENOUS | Status: DC | PRN
  Administered 2021-09-02 (×2): 80 ug via INTRAVENOUS

## 2021-09-02 MED ORDER — OXYCODONE HCL 5 MG PO TABS: 5.0000 mg | ORAL_TABLET | Freq: Once | ORAL | Status: DC | PRN

## 2021-09-02 MED ORDER — LIDOCAINE HCL (CARDIAC) PF 100 MG/5ML IV SOSY
PREFILLED_SYRINGE | INTRAVENOUS | Status: DC | PRN
  Administered 2021-09-02: 50 mg via INTRAVENOUS

## 2021-09-02 MED ORDER — SCOPOLAMINE 1 MG/3DAYS TD PT72
1.0000 | MEDICATED_PATCH | TRANSDERMAL | Status: DC
  Administered 2021-09-02: 1.5 mg via TRANSDERMAL

## 2021-09-02 MED ORDER — ENSURE PRE-SURGERY PO LIQD: 296.0000 mL | Freq: Once | ORAL | Status: DC

## 2021-09-02 MED ORDER — KETOROLAC TROMETHAMINE 30 MG/ML IJ SOLN
INTRAMUSCULAR | Status: AC
  Filled 2021-09-02: qty 1

## 2021-09-02 MED ORDER — KETOROLAC TROMETHAMINE 30 MG/ML IJ SOLN
INTRAMUSCULAR | Status: DC | PRN
  Administered 2021-09-02: 15 mg via INTRAVENOUS

## 2021-09-02 MED ORDER — MIDAZOLAM HCL 2 MG/2ML IJ SOLN
INTRAMUSCULAR | Status: AC
  Filled 2021-09-02: qty 2

## 2021-09-02 MED ORDER — ACETAMINOPHEN 500 MG PO TABS
1000.0000 mg | ORAL_TABLET | ORAL | Status: AC
  Administered 2021-09-02: 1000 mg via ORAL

## 2021-09-02 MED ORDER — ONDANSETRON HCL 4 MG/2ML IJ SOLN
INTRAMUSCULAR | Status: AC
Start: 2021-09-02 — End: ?
  Filled 2021-09-02: qty 2

## 2021-09-02 MED ORDER — BUPIVACAINE-EPINEPHRINE 0.5% -1:200000 IJ SOLN
INTRAMUSCULAR | Status: DC | PRN
  Administered 2021-09-02: 10 mL

## 2021-09-02 MED ORDER — CEFAZOLIN SODIUM-DEXTROSE 2-4 GM/100ML-% IV SOLN
INTRAVENOUS | Status: AC
  Filled 2021-09-02: qty 100

## 2021-09-02 MED ORDER — ACETAMINOPHEN 500 MG PO TABS
ORAL_TABLET | ORAL | Status: AC
  Filled 2021-09-02: qty 2

## 2021-09-02 MED ORDER — PROPOFOL 10 MG/ML IV BOLUS
INTRAVENOUS | Status: DC | PRN
  Administered 2021-09-02: 180 mg via INTRAVENOUS

## 2021-09-02 MED ORDER — LACTATED RINGERS IV SOLN: INTRAVENOUS | Status: DC

## 2021-09-02 MED ORDER — MIDAZOLAM HCL 5 MG/5ML IJ SOLN
INTRAMUSCULAR | Status: DC | PRN
  Administered 2021-09-02: 1 mg via INTRAVENOUS

## 2021-09-02 MED ORDER — PROPOFOL 10 MG/ML IV BOLUS
INTRAVENOUS | Status: AC
  Filled 2021-09-02: qty 20

## 2021-09-02 SURGICAL SUPPLY — 51 items
ADH SKN CLS APL DERMABOND .7 (GAUZE/BANDAGES/DRESSINGS) ×1
APL PRP STRL LF DISP 70% ISPRP (MISCELLANEOUS) ×1
APPLIER CLIP 9.375 MED OPEN (MISCELLANEOUS)
APR CLP MED 9.3 20 MLT OPN (MISCELLANEOUS)
BINDER BREAST 3XL (GAUZE/BANDAGES/DRESSINGS) IMPLANT
BINDER BREAST LRG (GAUZE/BANDAGES/DRESSINGS) IMPLANT
BINDER BREAST MEDIUM (GAUZE/BANDAGES/DRESSINGS) IMPLANT
BINDER BREAST XLRG (GAUZE/BANDAGES/DRESSINGS) IMPLANT
BINDER BREAST XXLRG (GAUZE/BANDAGES/DRESSINGS) IMPLANT
BLADE SURG 15 STRL LF DISP TIS (BLADE) ×1 IMPLANT
BLADE SURG 15 STRL SS (BLADE) ×2
CANISTER SUC SOCK COL 7IN (MISCELLANEOUS) IMPLANT
CANISTER SUCT 1200ML W/VALVE (MISCELLANEOUS) ×1 IMPLANT
CHLORAPREP W/TINT 26 (MISCELLANEOUS) ×2 IMPLANT
CLIP APPLIE 9.375 MED OPEN (MISCELLANEOUS) IMPLANT
COVER BACK TABLE 60X90IN (DRAPES) ×2 IMPLANT
COVER MAYO STAND STRL (DRAPES) ×2 IMPLANT
COVER PROBE W GEL 5X96 (DRAPES) ×2 IMPLANT
DERMABOND ADVANCED (GAUZE/BANDAGES/DRESSINGS) ×1
DERMABOND ADVANCED .7 DNX12 (GAUZE/BANDAGES/DRESSINGS) ×1 IMPLANT
DRAPE LAPAROSCOPIC ABDOMINAL (DRAPES) ×2 IMPLANT
DRAPE UTILITY XL STRL (DRAPES) ×2 IMPLANT
ELECT REM PT RETURN 9FT ADLT (ELECTROSURGICAL) ×2
ELECTRODE REM PT RTRN 9FT ADLT (ELECTROSURGICAL) ×1 IMPLANT
GAUZE SPONGE 4X4 12PLY STRL LF (GAUZE/BANDAGES/DRESSINGS) IMPLANT
GLOVE BIOGEL PI IND STRL 7.0 (GLOVE) IMPLANT
GLOVE BIOGEL PI INDICATOR 7.0 (GLOVE) ×2
GLOVE SURG SIGNA 7.5 PF LTX (GLOVE) ×2 IMPLANT
GLOVE SURG SS PI 7.0 STRL IVOR (GLOVE) ×1 IMPLANT
GOWN STRL REUS W/ TWL LRG LVL3 (GOWN DISPOSABLE) ×1 IMPLANT
GOWN STRL REUS W/ TWL XL LVL3 (GOWN DISPOSABLE) ×1 IMPLANT
GOWN STRL REUS W/TWL LRG LVL3 (GOWN DISPOSABLE) ×2
GOWN STRL REUS W/TWL XL LVL3 (GOWN DISPOSABLE) ×2
KIT MARKER MARGIN INK (KITS) ×2 IMPLANT
NDL HYPO 25X1 1.5 SAFETY (NEEDLE) ×1 IMPLANT
NEEDLE HYPO 25X1 1.5 SAFETY (NEEDLE) ×2 IMPLANT
NS IRRIG 1000ML POUR BTL (IV SOLUTION) ×1 IMPLANT
PACK BASIN DAY SURGERY FS (CUSTOM PROCEDURE TRAY) ×2 IMPLANT
PENCIL SMOKE EVACUATOR (MISCELLANEOUS) ×2 IMPLANT
SLEEVE SCD COMPRESS KNEE MED (STOCKING) ×2 IMPLANT
SPIKE FLUID TRANSFER (MISCELLANEOUS) IMPLANT
SPONGE T-LAP 4X18 ~~LOC~~+RFID (SPONGE) ×2 IMPLANT
SUT MNCRL AB 4-0 PS2 18 (SUTURE) ×3 IMPLANT
SUT SILK 2 0 SH (SUTURE) IMPLANT
SUT VIC AB 3-0 SH 27 (SUTURE) ×2
SUT VIC AB 3-0 SH 27X BRD (SUTURE) ×1 IMPLANT
SYR CONTROL 10ML LL (SYRINGE) ×2 IMPLANT
TOWEL GREEN STERILE FF (TOWEL DISPOSABLE) ×2 IMPLANT
TRAY FAXITRON CT DISP (TRAY / TRAY PROCEDURE) ×2 IMPLANT
TUBE CONNECTING 20X1/4 (TUBING) ×1 IMPLANT
YANKAUER SUCT BULB TIP NO VENT (SUCTIONS) ×1 IMPLANT

## 2021-09-02 NOTE — Anesthesia Preprocedure Evaluation (Addendum)
Anesthesia Evaluation  Patient identified by MRN, date of birth, ID band Patient awake    Reviewed: Allergy & Precautions, NPO status , Patient's Chart, lab work & pertinent test results  History of Anesthesia Complications (+) PONV and history of anesthetic complications  Airway Mallampati: I  TM Distance: >3 FB Neck ROM: Full   Comment: Long uvula Dental no notable dental hx.    Pulmonary neg pulmonary ROS, former smoker,    Pulmonary exam normal breath sounds clear to auscultation       Cardiovascular hypertension, Normal cardiovascular exam Rhythm:Regular Rate:Normal     Neuro/Psych  Headaches, negative psych ROS   GI/Hepatic negative GI ROS, Neg liver ROS,   Endo/Other  hyperlipidemia  Renal/GU negative Renal ROS  negative genitourinary   Musculoskeletal  (+) Arthritis , Osteoarthritis,    Abdominal   Peds negative pediatric ROS (+)  Hematology negative hematology ROS (+)   Anesthesia Other Findings   Reproductive/Obstetrics negative OB ROS                            Anesthesia Physical Anesthesia Plan  ASA: 2  Anesthesia Plan: General   Post-op Pain Management: Tylenol PO (pre-op)*   Induction: Intravenous  PONV Risk Score and Plan: Scopolamine patch - Pre-op, Treatment may vary due to age or medical condition, Midazolam, Ondansetron and Dexamethasone  Airway Management Planned: LMA  Additional Equipment: None  Intra-op Plan:   Post-operative Plan: Extubation in OR  Informed Consent: I have reviewed the patients History and Physical, chart, labs and discussed the procedure including the risks, benefits and alternatives for the proposed anesthesia with the patient or authorized representative who has indicated his/her understanding and acceptance.     Dental advisory given  Plan Discussed with: CRNA, Surgeon and Anesthesiologist  Anesthesia Plan Comments:          Anesthesia Quick Evaluation

## 2021-09-02 NOTE — Anesthesia Postprocedure Evaluation (Signed)
Anesthesia Post Note  Patient: Taylor Boyer  Procedure(s) Performed: RIGHT BREAST LUMPECTOMY WITH RADIOACTIVE SEED LOCALIZATION (Right: Breast)     Patient location during evaluation: PACU Anesthesia Type: General Level of consciousness: awake Pain management: pain level controlled Vital Signs Assessment: post-procedure vital signs reviewed and stable Respiratory status: spontaneous breathing and respiratory function stable Cardiovascular status: stable Postop Assessment: no apparent nausea or vomiting Anesthetic complications: no   No notable events documented.  Last Vitals:  Vitals:   09/02/21 0945 09/02/21 1000  BP: 114/66 111/68  Pulse: 82 76  Resp: (!) 9 (!) 9  Temp:    SpO2: 96% 97%    Last Pain:  Vitals:   09/02/21 1000  TempSrc:   PainSc: 2                  Merlinda Frederick

## 2021-09-02 NOTE — Op Note (Signed)
RIGHT BREAST LUMPECTOMY WITH RADIOACTIVE SEED LOCALIZATION  Procedure Note  Taylor Boyer 09/02/2021   Pre-op Diagnosis: RIGHT BREAST INTRADUCTAL PAPILLOMA     Post-op Diagnosis: same  Procedure(s): RIGHT BREAST LUMPECTOMY WITH RADIOACTIVE SEED LOCALIZATION  Surgeon(s): Coralie Keens, MD  Anesthesia: General  Staff:  Circulator: Ted Mcalpine, RN Scrub Person: Ward, Scarlette Calico  Estimated Blood Loss: Minimal               Specimens: sent to path  Indications: This is a 66 year old female was found to have a small mass in the right breast on screening mammography with a dilated duct on ultrasound.  A biopsy of the mass showed an intraductal papilloma.  Given its size, location, and her strong family history of breast cancer the decision was made to proceed with a lumpectomy  Procedure: The patient was brought to operating identifies correct patient.  She was placed upon the operating table general esthesia was induced.  Her right breast was prepped and draped in the usual sterile fashion.  The radioactive seed was located with the aid of neoprobe at the 6-7 clock position of the right breast just underneath the nipple areolar complex.  I anesthetized skin around the edge of the areola in this location with Marcaine.  I then made incision with a scalpel.  With the aid of the neoprobe I then dissected down to the breast tissue staying around the radioactive seed with the cautery.  I then dissected deep to the seed and completed the lumpectomy staying widely around the area.  Once the specimen was removed I confirmed that the seed was in the specimen with the neoprobe.  I marked all margins with paint.  An x-ray was performed on the specimen confirming that the radioactive seed and previous biopsy clip were in the specimen.  The specimen was then sent to pathology for evaluation.  I achieved hemostasis with the cautery.  I anesthetized the incision further with Marcaine.  I  then closed the subcutaneous tissue with interrupted 3-0 Vicryl sutures and closed the skin with a running 4-0 Monocryl suture.  Dermabond was then applied.  The patient tolerated the procedure well.  All the counts were correct at the end of the procedure.  The patient was then extubated in the operating room and taken in stable condition to the recovery room.          Coralie Keens   Date: 09/02/2021  Time: 9:26 AM

## 2021-09-02 NOTE — Discharge Instructions (Addendum)
[place] Office Phone Number [phone]  BREAST BIOPSY/ PARTIAL MASTECTOMY: POST OP INSTRUCTIONS  Always review your discharge instruction sheet given to you by the facility where your surgery was performed.  IF YOU HAVE DISABILITY OR FAMILY LEAVE FORMS, YOU MUST BRING THEM TO THE OFFICE FOR PROCESSING.  DO NOT GIVE THEM TO YOUR DOCTOR.  A prescription for pain medication may be given to you upon discharge.  Take your pain medication as prescribed, if needed.  If narcotic pain medicine is not needed, then you may take acetaminophen (Tylenol) or ibuprofen (Advil) as needed. Take your usually prescribed medications unless otherwise directed If you need a refill on your pain medication, please contact your pharmacy.  They will contact our office to request authorization.  Prescriptions will not be filled after 5pm or on week-ends. You should eat very light the first 24 hours after surgery, such as soup, crackers, pudding, etc.  Resume your normal diet the day after surgery. Most patients will experience some swelling and bruising in the breast.  Ice packs and a good support bra will help.  Swelling and bruising can take several days to resolve.  It is common to experience some constipation if taking pain medication after surgery.  Increasing fluid intake and taking a stool softener will usually help or prevent this problem from occurring.  A mild laxative (Milk of Magnesia or Miralax) should be taken according to package directions if there are no bowel movements after 48 hours. Unless discharge instructions indicate otherwise, you may remove your bandages 24-48 hours after surgery, and you may shower at that time.  You may have steri-strips (small skin tapes) in place directly over the incision.  These strips should be left on the skin for 7-10 days.  If your surgeon used skin glue on the incision, you may shower in 24 hours.  The glue will flake off over the next 2-3 weeks.  Any  sutures or staples will be removed at the office during your follow-up visit. ACTIVITIES:  You may resume regular daily activities (gradually increasing) beginning the next day.  Wearing a good support bra or sports bra minimizes pain and swelling.  You may have sexual intercourse when it is comfortable. You may drive when you no longer are taking prescription pain medication, you can comfortably wear a seatbelt, and you can safely maneuver your car and apply brakes. RETURN TO WORK:  ______________________________________________________________________________________ Dennis Bast should see your doctor in the office for a follow-up appointment approximately two weeks after your surgery.  Your doctor's nurse will typically make your follow-up appointment when she calls you with your pathology report.  Expect your pathology report 2-3 business days after your surgery.  You may call to check if you do not hear from [place] after three days. OTHER INSTRUCTIONS: OK TO SHOWER STARTING TOMORROW NO SOAKING IN A TUB FOR ONE WEEK ICE PACK, TYLENOL, AND IBUPROFEN ALSO FOR PAIN _______________________________________________________________________________________________ _____________________________________________________________________________________________________________________________________ _____________________________________________________________________________________________________________________________________ _____________________________________________________________________________________________________________________________________  WHEN TO CALL YOUR DOCTOR: Fever over 101.0 Nausea and/or vomiting. Extreme swelling or bruising. Continued bleeding from incision. Increased pain, redness, or drainage from the incision.  The clinic staff is available to answer your questions during regular business hours.  Please don't hesitate to call and ask to speak to one of the nurses for clinical  concerns.  If you have a medical emergency, go to the nearest emergency room or call 911.  A surgeon from [facility] is always on call at the hospital.  For further questions,  please visit [email or web address]     Post Anesthesia Home Care Instructions  Activity: Get plenty of rest for the remainder of the day. A responsible individual must stay with you for 24 hours following the procedure.  For the next 24 hours, DO NOT: -Drive a car -Paediatric nurse -Drink alcoholic beverages -Take any medication unless instructed by your physician -Make any legal decisions or sign important papers.  Meals: Start with liquid foods such as gelatin or soup. Progress to regular foods as tolerated. Avoid greasy, spicy, heavy foods. If nausea and/or vomiting occur, drink only clear liquids until the nausea and/or vomiting subsides. Call your physician if vomiting continues.  Special Instructions/Symptoms: Your throat may feel dry or sore from the anesthesia or the breathing tube placed in your throat during surgery. If this causes discomfort, gargle with warm salt water. The discomfort should disappear within 24 hours.  If you had a scopolamine patch placed behind your ear for the management of post- operative nausea and/or vomiting:  1. The medication in the patch is effective for 72 hours, after which it should be removed.  Wrap patch in a tissue and discard in the trash. Wash hands thoroughly with soap and water. 2. You may remove the patch earlier than 72 hours if you experience unpleasant side effects which may include dry mouth, dizziness or visual disturbances. 3. Avoid touching the patch. Wash your hands with soap and water after contact with the patch.     No Tylenol until after 1:40pm. No Ibuprofen until after 3:20pm.

## 2021-09-02 NOTE — Transfer of Care (Signed)
Immediate Anesthesia Transfer of Care Note  Patient: Taylor Boyer  Procedure(s) Performed: RIGHT BREAST LUMPECTOMY WITH RADIOACTIVE SEED LOCALIZATION (Right: Breast)  Patient Location: PACU  Anesthesia Type:General  Level of Consciousness: awake, alert  and oriented  Airway & Oxygen Therapy: Patient Spontanous Breathing and Patient connected to nasal cannula oxygen  Post-op Assessment: Report given to RN and Post -op Vital signs reviewed and stable  Post vital signs: Reviewed and stable  Last Vitals:  Vitals Value Taken Time  BP 119/63 09/02/21 0930  Temp    Pulse 86 09/02/21 0931  Resp 10 09/02/21 0931  SpO2 99 % 09/02/21 0931  Vitals shown include unvalidated device data.  Last Pain:  Vitals:   09/02/21 0720  TempSrc: Oral  PainSc: 0-No pain      Patients Stated Pain Goal: 8 (70/34/03 5248)  Complications: No notable events documented.

## 2021-09-02 NOTE — Interval H&P Note (Signed)
History and Physical Interval Note:no change in H and P  09/02/2021 7:20 AM  [name]  has presented today for surgery, with the diagnosis of RIGHT BREAST INTRADUCTAL PAPILLOMA.  The various methods of treatment have been discussed with the patient and family. After consideration of risks, benefits and other options for treatment, the patient has consented to  Procedure(s) with comments: RIGHT BREAST LUMPECTOMY WITH RADIOACTIVE SEED LOCALIZATION (Right) - LMA as a surgical intervention.  The patient's history has been reviewed, patient examined, no change in status, stable for surgery.  I have reviewed the patient's chart and labs.  Questions were answered to the patient's satisfaction.     Taylor Boyer

## 2021-09-02 NOTE — Anesthesia Procedure Notes (Signed)
Procedure Name: LMA Insertion Date/Time: 09/02/2021 8:52 AM  Performed by: Bufford Spikes, CRNAPre-anesthesia Checklist: Patient identified, Emergency Drugs available, Suction available and Patient being monitored Patient Re-evaluated:Patient Re-evaluated prior to induction Oxygen Delivery Method: Circle system utilized Preoxygenation: Pre-oxygenation with 100% oxygen Induction Type: IV induction Ventilation: Mask ventilation without difficulty LMA: LMA inserted LMA Size: 4.0 Number of attempts: 1 Airway Equipment and Method: Bite block Placement Confirmation: positive ETCO2 Tube secured with: Tape Dental Injury: Teeth and Oropharynx as per pre-operative assessment

## 2021-09-03 ENCOUNTER — Encounter (HOSPITAL_BASED_OUTPATIENT_CLINIC_OR_DEPARTMENT_OTHER): Payer: Self-pay | Admitting: Surgery

## 2021-09-03 LAB — SURGICAL PATHOLOGY

## 2021-09-13 ENCOUNTER — Encounter (HOSPITAL_COMMUNITY): Payer: Self-pay

## 2021-12-13 ENCOUNTER — Encounter: Payer: Self-pay | Admitting: Gastroenterology

## 2021-12-16 ENCOUNTER — Other Ambulatory Visit: Payer: Self-pay

## 2021-12-16 ENCOUNTER — Ambulatory Visit (AMBULATORY_SURGERY_CENTER): Payer: Self-pay

## 2021-12-16 VITALS — Ht 66.0 in | Wt 159.6 lb

## 2021-12-16 DIAGNOSIS — Z86010 Personal history of colonic polyps: Secondary | ICD-10-CM

## 2021-12-16 MED ORDER — NA SULFATE-K SULFATE-MG SULF 17.5-3.13-1.6 GM/177ML PO SOLN: 1.0000 | Freq: Once | ORAL | 0 refills | Status: AC

## 2021-12-16 NOTE — Progress Notes (Signed)
Denies allergies to eggs or soy products. Denies complication of anesthesia or sedation. Denies use of weight loss medication. Denies use of O2.   Emmi instructions given for colonoscopy.  

## 2022-01-14 ENCOUNTER — Encounter: Payer: Self-pay | Admitting: Gastroenterology

## 2022-01-14 ENCOUNTER — Ambulatory Visit (AMBULATORY_SURGERY_CENTER): Payer: Medicare Other | Admitting: Gastroenterology

## 2022-01-14 VITALS — BP 113/71 | HR 69 | Temp 98.4°F | Resp 12 | Ht 66.0 in | Wt 159.6 lb

## 2022-01-14 DIAGNOSIS — Z86010 Personal history of colonic polyps: Secondary | ICD-10-CM | POA: Diagnosis not present

## 2022-01-14 DIAGNOSIS — D123 Benign neoplasm of transverse colon: Secondary | ICD-10-CM | POA: Diagnosis not present

## 2022-01-14 DIAGNOSIS — Z09 Encounter for follow-up examination after completed treatment for conditions other than malignant neoplasm: Secondary | ICD-10-CM

## 2022-01-14 MED ORDER — SODIUM CHLORIDE 0.9 % IV SOLN: 500.0000 mL | Freq: Once | INTRAVENOUS | Status: DC

## 2022-01-14 NOTE — Progress Notes (Signed)
Vss nad trans to pacu °

## 2022-01-14 NOTE — Progress Notes (Signed)
Referring Provider: Fanny Bien, MD Primary Care Physician:  Fanny Bien, MD  Indication for Procedure:  Colon cancer Surveillance   IMPRESSION:  Need for colon cancer surveillance Appropriate candidate for monitored anesthesia care  PLAN: Colonoscopy in the [place] today   HPI: Taylor Boyer is a 66 y.o. female presents for surveillance colonoscopy.  Prior endoscopic history: Colonoscopy 08/01/15 in [place]: 5 polyps, sigmoid diverticulosis Adenomas and hyperplastic polyps on pathology Surveillance recommended in 3 years.  Colonoscopy 2020: 5 tubular adenomas Surveillance colonoscopy recommended in 3 years.    No known family history of colon cancer or polyps. No family history of uterine/endometrial cancer, pancreatic cancer or gastric/stomach cancer.   Past Medical History:  Diagnosis Date   Allergy    Anxiety    Arthritis    knees   Cancer ([place])    basal cell skin cancer   GERD (gastroesophageal reflux disease)    History of adenomatous polyp of colon    Hyperlipidemia    Hypertension    Migraines    PONV (postoperative nausea and vomiting)     Past Surgical History:  Procedure Laterality Date   BREAST BIOPSY Right 07/12/2021   BREAST LUMPECTOMY WITH RADIOACTIVE SEED LOCALIZATION Right 09/02/2021   Procedure: RIGHT BREAST LUMPECTOMY WITH RADIOACTIVE SEED LOCALIZATION;  Surgeon: Coralie Keens, MD;  Location: [place];  Service: General;  Laterality: Right;  LMA   BUNIONECTOMY Right    CHOLECYSTECTOMY     COLONOSCOPY     DILATION AND CURETTAGE OF UTERUS     JOINT REPLACEMENT Right    knee   KNEE ARTHROSCOPY WITH LATERAL RELEASE Right    POLYPECTOMY     R tka Right 12/14/2020   ROTATOR CUFF REPAIR Right    TONSILLECTOMY      Current Outpatient Medications  Medication Sig Dispense Refill   cholecalciferol (VITAMIN D3) 25 MCG (1000 UNIT) tablet Take 1,000 Units by mouth daily.     Coenzyme Q10 (CO Q 10 PO) Take 300 mg  by mouth daily.     lisinopril (ZESTRIL) 10 MG tablet TK 1 T PO D     Multiple Vitamin (MULTIVITAMIN) capsule Take 1 capsule by mouth daily.     omeprazole (PRILOSEC) 20 MG capsule Take 20 mg by mouth daily.     OVER THE COUNTER MEDICATION Bergamot Oil, Two tablets daily.     rosuvastatin (CRESTOR) 5 MG tablet TK 1 T PO D     SPIRULINA PO Take by mouth.     THYROID PO Take by mouth. Thyroid supplement daily     VALACYCLOVIR HCL PO      EPINEPHrine 0.3 mg/0.3 mL IJ SOAJ injection Inject 0.3 mg into the muscle as needed for anaphylaxis.     fexofenadine (ALLEGRA) 180 MG tablet Take 180 mg by mouth daily.     Magnesium Citrate 100 MG CAPS Take by mouth.     Prasterone (INTRAROSA) 6.5 MG INST Place vaginally.     SUMAtriptan (IMITREX) 100 MG tablet Take 100 mg by mouth every 2 (two) hours as needed for migraine. May repeat in 2 hours if headache persists or recurs.     traMADol (ULTRAM) 50 MG tablet Take 1 tablet (50 mg total) by mouth every 6 (six) hours as needed for moderate pain or severe pain. 20 tablet 0   Current Facility-Administered Medications  Medication Dose Route Frequency Provider Last Rate Last Admin   0.9 %  sodium chloride infusion  500  mL Intravenous Once Thornton Park, MD        Allergies as of 01/14/2022 - Review Complete 01/14/2022  Allergen Reaction Noted   Barium-containing compounds  11/25/2018   Fish allergy Swelling 11/25/2018   Other  11/25/2018    Family History  Problem Relation Age of Onset   Bladder Cancer Father    Brain cancer Son    Breast cancer Neg Hx    Colon cancer Neg Hx    Colon polyps Neg Hx    Esophageal cancer Neg Hx    Rectal cancer Neg Hx    Stomach cancer Neg Hx      Physical Exam: General:   Alert,  well-nourished, pleasant and cooperative in NAD Head:  Normocephalic and atraumatic. Eyes:  Sclera clear, no icterus.   Conjunctiva pink. Mouth:  No deformity or lesions.   Neck:  Supple; no masses or thyromegaly. Lungs:  Clear  throughout to auscultation.   No wheezes. Heart:  Regular rate and rhythm; no murmurs. Abdomen:  Soft, non-tender, nondistended, normal bowel sounds, no rebound or guarding.  Msk:  Symmetrical. No boney deformities LAD: No inguinal or umbilical LAD Extremities:  No clubbing or edema. Neurologic:  Alert and  oriented x4;  grossly nonfocal Skin:  No obvious rash or bruise. Psych:  Alert and cooperative. Normal mood and affect.     Studies/Results: No results found.    Suellen Durocher L. Tarri Glenn, MD, MPH 01/14/2022, 8:36 AM

## 2022-01-14 NOTE — Progress Notes (Signed)
Called to room to assist during endoscopic procedure.  Patient ID and intended procedure confirmed with present staff. Received instructions for my participation in the procedure from the performing physician.  

## 2022-01-14 NOTE — Progress Notes (Signed)
VS completed by DT.  Pt's states no medical or surgical changes since previsit or office visit.  

## 2022-01-14 NOTE — Patient Instructions (Signed)
Read all of the handouts given to you by your recovery room nurse.  Resume all of your current medications.  YOU HAD AN ENDOSCOPIC PROCEDURE TODAY AT [facility]:   Refer to the procedure report that was given to you for any specific questions about what was found during the examination.  If the procedure report does not answer your questions, please call your gastroenterologist to clarify.  If you requested that your care partner not be given the details of your procedure findings, then the procedure report has been included in a sealed envelope for you to review at your convenience later.  YOU SHOULD EXPECT: Some feelings of bloating in the abdomen. Passage of more gas than usual.  Walking can help get rid of the air that was put into your GI tract during the procedure and reduce the bloating. If you had a lower endoscopy (such as a colonoscopy or flexible sigmoidoscopy) you may notice spotting of blood in your stool or on the toilet paper. If you underwent a bowel prep for your procedure, you may not have a normal bowel movement for a few days.  Please Note:  You might notice some irritation and congestion in your nose or some drainage.  This is from the oxygen used during your procedure.  There is no need for concern and it should clear up in a day or so.  SYMPTOMS TO REPORT IMMEDIATELY:  Following lower endoscopy (colonoscopy or flexible sigmoidoscopy):  Excessive amounts of blood in the stool  Significant tenderness or worsening of abdominal pains  Swelling of the abdomen that is new, acute  Fever of 100F or higher   For urgent or emergent issues, a gastroenterologist can be reached at any hour by calling [phone]. Do not use MyChart messaging for urgent concerns.    DIET:  We do recommend a small meal at first, but then you may proceed to your regular diet.  Drink plenty of fluids but you should avoid alcoholic beverages for 24 hours.  Try to eat more fiber, and  drink more water.  ACTIVITY:  You should plan to take it easy for the rest of today and you should NOT DRIVE or use heavy machinery until tomorrow (because of the sedation medicines used during the test).    FOLLOW UP: Our staff will call the number listed on your records the next business day following your procedure.  We will call around 7:15- 8:00 am to check on you and address any questions or concerns that you may have regarding the information given to you following your procedure. If we do not reach you, we will leave a message.     If any biopsies were taken you will be contacted by phone or by letter within the next 1-3 weeks.  Please call us at [phone] if you have not heard about the biopsies in 3 weeks.    SIGNATURES/CONFIDENTIALITY: You and/or your care partner have signed paperwork which will be entered into your electronic medical record.  These signatures attest to the fact that that the information above on your After Visit Summary has been reviewed and is understood.  Full responsibility of the confidentiality of this discharge information lies with you and/or your care-partner.

## 2022-01-14 NOTE — Op Note (Signed)
[facility] Patient Name: Taylor Boyer Procedure Date: 01/14/2022 8:38 AM MRN: [id number] Endoscopist: Thornton Park MD, MD, [id number] Age: 66 Referring MD:  Date of Birth: 1955/05/19 Gender: Female Account #: [id number] Procedure:                Colonoscopy Indications:              High risk colon cancer surveillance: Personal                            history of multiple (3 or more) adenomas                           Colonoscopy in [place] in 2017: 5 polyps - both                            adenomas and hyperplastic polyps                           Colonoscopy 2020: 5 tubular adenomas Medicines:                Monitored Anesthesia Care Procedure:                Pre-Anesthesia Assessment:                           - Prior to the procedure, a History and Physical                            was performed, and patient medications and                            allergies were reviewed. The patient's tolerance of                            previous anesthesia was also reviewed. The risks                            and benefits of the procedure and the sedation                            options and risks were discussed with the patient.                            All questions were answered, and informed consent                            was obtained. Prior Anticoagulants: The patient has                            taken no anticoagulant or antiplatelet agents. ASA                            Grade Assessment: II - A patient with mild systemic  disease. After reviewing the risks and benefits,                            the patient was deemed in satisfactory condition to                            undergo the procedure.                           After obtaining informed consent, the colonoscope                            was passed under direct vision. Throughout the                            procedure, the patient's blood pressure,  pulse, and                            oxygen saturations were monitored continuously. The                            CF [id number] #[id number] was introduced through the anus                            and advanced to the 3 cm into the ileum. A second                            forward view of the right colon was performed. The                            colonoscopy was performed without difficulty. The                            patient tolerated the procedure well. The quality                            of the bowel preparation was good. The terminal                            ileum, ileocecal valve, appendiceal orifice, and                            rectum were photographed. Scope In: 8:45:46 AM Scope Out: 9:01:52 AM Scope Withdrawal Time: 0 hours 12 minutes 4 seconds  Total Procedure Duration: 0 hours 16 minutes 6 seconds  Findings:                 The perianal and digital rectal examinations were                            normal.                           Multiple medium-mouthed and small-mouthed  diverticula were found in the sigmoid colon.                           A 4 mm polyp was found in the splenic flexure. The                            polyp was flat. The polyp was removed with a cold                            snare. Resection and retrieval were complete.                            Estimated blood loss was minimal.                           The exam was otherwise without abnormality on                            direct and retroflexion views. Complications:            No immediate complications. Estimated Blood Loss:     Estimated blood loss was minimal. Impression:               - Diverticulosis in the sigmoid colon.                           - One 4 mm polyp at the splenic flexure, removed                            with a cold snare. Resected and retrieved.                           - The examination was otherwise normal on direct                             and retroflexion views. Recommendation:           - Patient has a contact number available for                            emergencies. The signs and symptoms of potential                            delayed complications were discussed with the                            patient. Return to normal activities tomorrow.                            Written discharge instructions were provided to the                            patient.                           -  Continue present medications.                           - Await pathology results.                           - Repeat colonoscopy in 5 years for surveillance                            regardless of pathology results given the history                            of polyps.                           - Follow a high fiber diet. Drink at least 64                            ounces of water daily. Add a daily stool bulking                            agent such as psyllium (an exampled would be                            Metamucil).                           - Emerging evidence supports eating a diet of                            fruits, vegetables, grains, calcium, and yogurt                            while reducing red meat and alcohol may reduce the                            risk of colon cancer.                           - Thank you for allowing me to be involved in your                            colon cancer prevention. Thornton Park MD, MD 01/14/2022 9:08:48 AM This report has been signed electronically.

## 2022-01-15 ENCOUNTER — Telehealth: Payer: Self-pay

## 2022-01-15 NOTE — Telephone Encounter (Signed)
  Follow up Call-     01/14/2022    7:50 AM  Call back number  Post procedure Call Back phone  # [phone]  Permission to leave phone message Yes     Patient questions:  Do you have a fever, pain , or abdominal swelling? No. Pain Score  0 *  Have you tolerated food without any problems? Yes.    Have you been able to return to your normal activities? Yes.    Do you have any questions about your discharge instructions: Diet   No. Medications  No. Follow up visit  No.  Do you have questions or concerns about your Care? No.  Actions: * If pain score is 4 or above: No action needed, pain <4.

## 2022-01-17 ENCOUNTER — Encounter: Payer: Self-pay | Admitting: Gastroenterology

## 2022-06-20 ENCOUNTER — Other Ambulatory Visit: Payer: Self-pay | Admitting: Family Medicine

## 2022-06-20 DIAGNOSIS — Z1231 Encounter for screening mammogram for malignant neoplasm of breast: Secondary | ICD-10-CM

## 2022-07-11 ENCOUNTER — Ambulatory Visit
Admission: RE | Admit: 2022-07-11 | Discharge: 2022-07-11 | Disposition: A | Discharge status: Home / Self Care | Payer: Medicare Other | Source: Ambulatory Visit | Attending: Family Medicine | Admitting: Family Medicine

## 2022-07-11 DIAGNOSIS — Z1231 Encounter for screening mammogram for malignant neoplasm of breast: Secondary | ICD-10-CM

## 2022-08-21 IMAGING — MG MM BREAST SURGICAL SPECIMEN
1 series · 2 of 2 positions shown · non-contrast
Comparison: Previous exams.

CLINICAL DATA: Status post surgical excision today after earlier
radioactive seed localization.

EXAM:
SPECIMEN RADIOGRAPH OF THE RIGHT BREAST

[Series 1: R · right · 0.07mm/px · 2 of 2 slices shown]
[im 1/2]
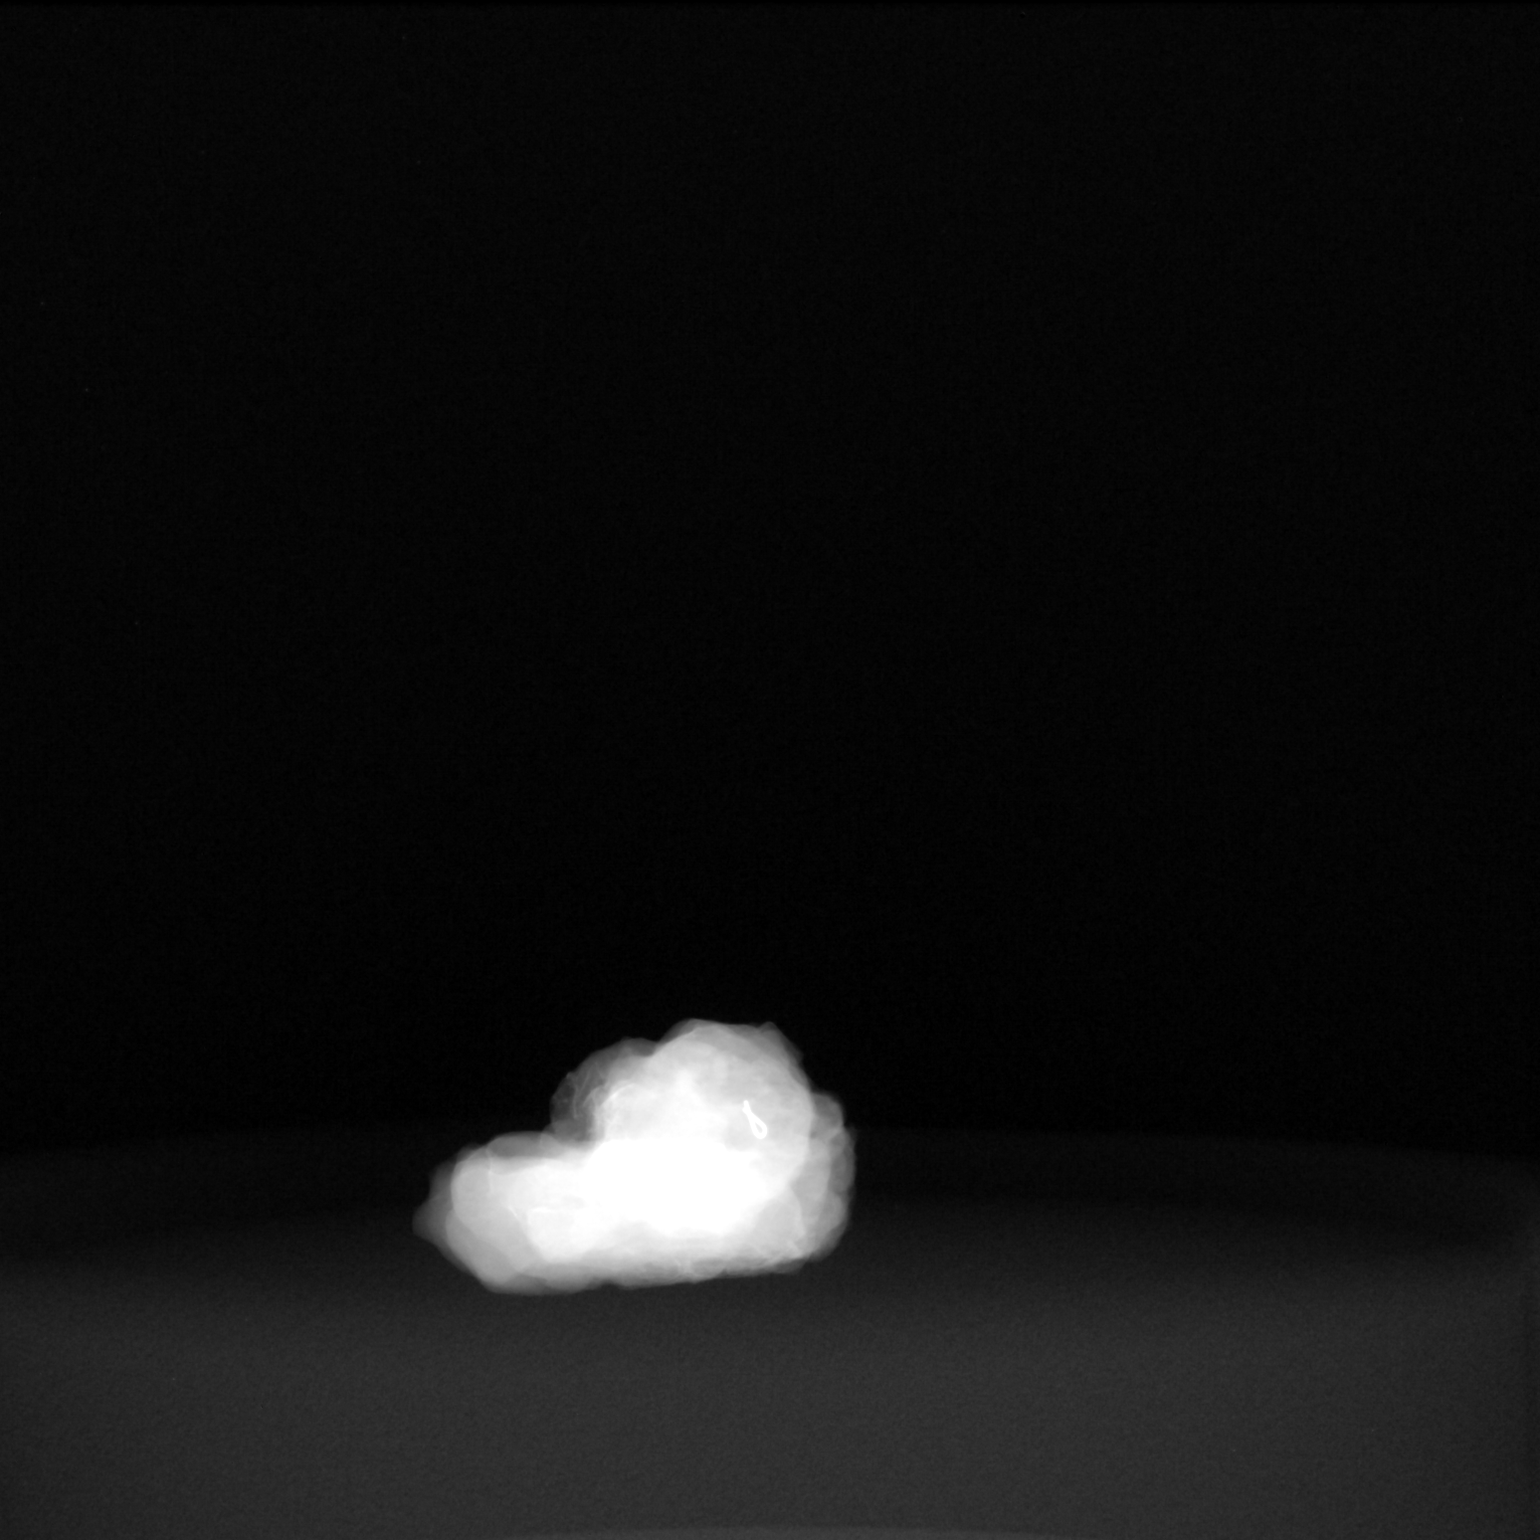
[im 2/2]
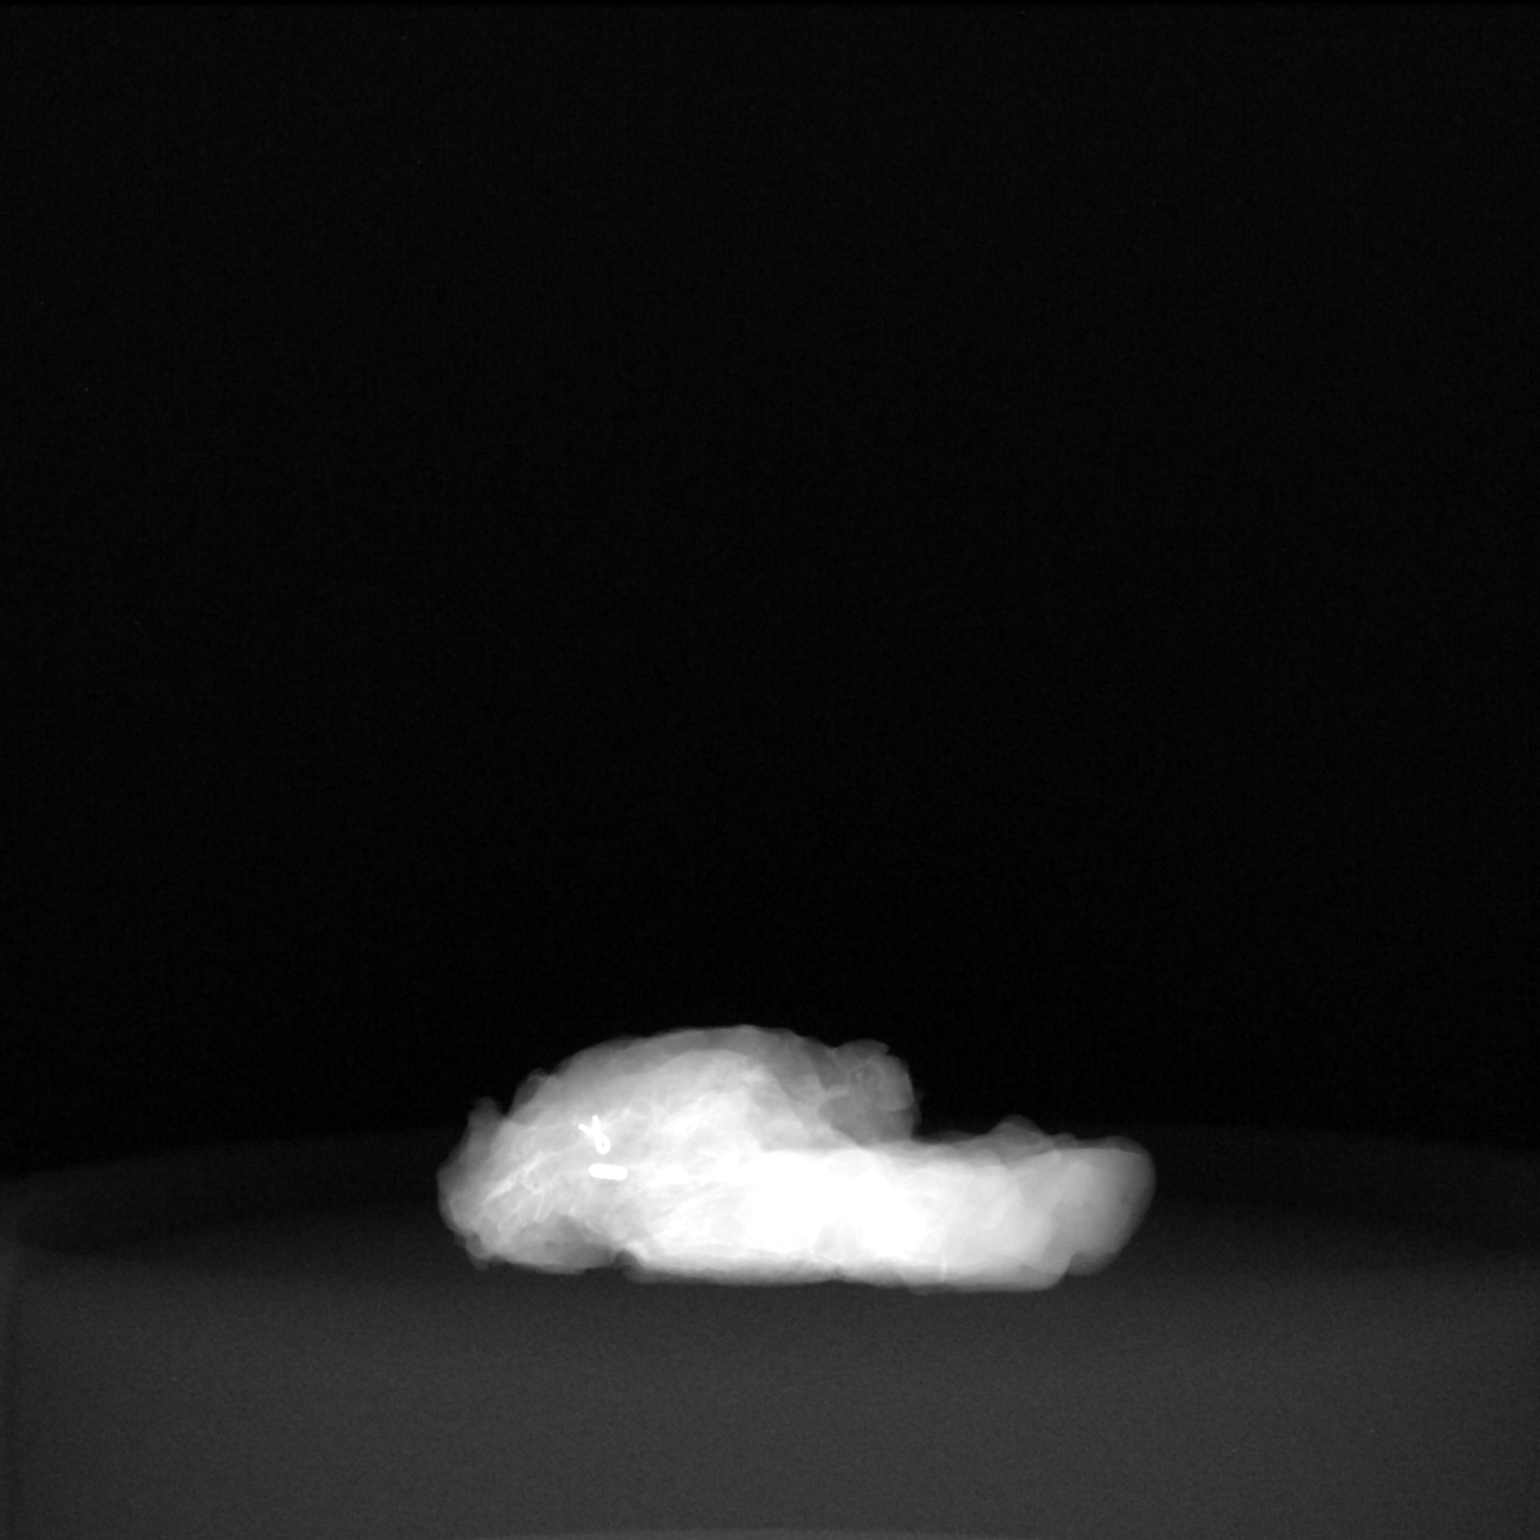

[2 of 2 positions shown; findings below may reference images not displayed]

FINDINGS: Status post excision of the right breast. The radioactive seed and
biopsy marker clip are present and appear completely intact within
the specimen. Findings discussed with the OR staff during the
procedure.
IMPRESSION: Specimen radiograph of the right breast.

## 2023-05-28 ENCOUNTER — Other Ambulatory Visit: Payer: Self-pay | Admitting: Family Medicine

## 2023-05-28 DIAGNOSIS — Z1231 Encounter for screening mammogram for malignant neoplasm of breast: Secondary | ICD-10-CM

## 2023-07-13 ENCOUNTER — Ambulatory Visit

## 2023-07-21 ENCOUNTER — Ambulatory Visit
Admission: RE | Admit: 2023-07-21 | Discharge: 2023-07-21 | Disposition: A | Discharge status: Home / Self Care | Source: Ambulatory Visit | Attending: Family Medicine | Admitting: Family Medicine

## 2023-07-21 DIAGNOSIS — Z1231 Encounter for screening mammogram for malignant neoplasm of breast: Secondary | ICD-10-CM
